# Patient Record
Sex: Female | Born: 1955 | Race: White | Hispanic: No | Smoking: Never smoker
Health system: Southern US, Community
[De-identification: ages and names within clinical notes are randomized; demographics above are authoritative.]

## PROBLEM LIST (undated history)

## (undated) DIAGNOSIS — F32A Depression, unspecified: Secondary | ICD-10-CM

## (undated) DIAGNOSIS — M1990 Unspecified osteoarthritis, unspecified site: Secondary | ICD-10-CM

## (undated) DIAGNOSIS — I10 Essential (primary) hypertension: Secondary | ICD-10-CM

## (undated) DIAGNOSIS — J449 Chronic obstructive pulmonary disease, unspecified: Secondary | ICD-10-CM

## (undated) DIAGNOSIS — F329 Major depressive disorder, single episode, unspecified: Secondary | ICD-10-CM

## (undated) HISTORY — DX: Depression, unspecified: F32.A

## (undated) HISTORY — DX: Essential (primary) hypertension: I10

## (undated) HISTORY — PX: FOOT SURGERY: SHX648

## (undated) HISTORY — DX: Major depressive disorder, single episode, unspecified: F32.9

## (undated) HISTORY — PX: KNEE SURGERY: SHX244

---

## 2000-06-30 ENCOUNTER — Encounter: Payer: Self-pay | Admitting: Surgery

## 2000-07-04 ENCOUNTER — Observation Stay (HOSPITAL_COMMUNITY): Admission: RE | Admit: 2000-07-04 | Discharge: 2000-07-05 | Payer: Self-pay | Admitting: Dermatology

## 2000-09-22 ENCOUNTER — Encounter: Admission: RE | Admit: 2000-09-22 | Discharge: 2000-12-21 | Payer: Self-pay | Admitting: Family Medicine

## 2000-11-28 ENCOUNTER — Ambulatory Visit (HOSPITAL_COMMUNITY): Admission: RE | Admit: 2000-11-28 | Discharge: 2000-11-28 | Payer: Self-pay | Admitting: Gastroenterology

## 2000-11-28 ENCOUNTER — Encounter: Payer: Self-pay | Admitting: Gastroenterology

## 2001-03-16 ENCOUNTER — Inpatient Hospital Stay (HOSPITAL_COMMUNITY): Admission: RE | Admit: 2001-03-16 | Discharge: 2001-03-20 | Payer: Self-pay | Admitting: Surgery

## 2001-03-18 ENCOUNTER — Encounter: Payer: Self-pay | Admitting: Surgery

## 2001-11-05 ENCOUNTER — Encounter: Payer: Self-pay | Admitting: Emergency Medicine

## 2001-11-05 ENCOUNTER — Inpatient Hospital Stay (HOSPITAL_COMMUNITY): Admission: EM | Admit: 2001-11-05 | Discharge: 2001-11-06 | Payer: Self-pay | Admitting: Emergency Medicine

## 2001-11-06 ENCOUNTER — Encounter: Payer: Self-pay | Admitting: Internal Medicine

## 2001-12-17 ENCOUNTER — Ambulatory Visit (HOSPITAL_COMMUNITY): Admission: RE | Admit: 2001-12-17 | Discharge: 2001-12-17 | Payer: Self-pay | Admitting: Specialist

## 2002-01-27 ENCOUNTER — Encounter: Admission: RE | Admit: 2002-01-27 | Discharge: 2002-04-27 | Payer: Self-pay | Admitting: Family Medicine

## 2003-12-13 ENCOUNTER — Ambulatory Visit (HOSPITAL_BASED_OUTPATIENT_CLINIC_OR_DEPARTMENT_OTHER): Admission: RE | Admit: 2003-12-13 | Discharge: 2003-12-13 | Payer: Self-pay | Admitting: Orthopedic Surgery

## 2005-01-24 ENCOUNTER — Other Ambulatory Visit: Admission: RE | Admit: 2005-01-24 | Discharge: 2005-01-24 | Payer: Self-pay | Admitting: *Deleted

## 2005-01-27 ENCOUNTER — Emergency Department (HOSPITAL_COMMUNITY): Admission: EM | Admit: 2005-01-27 | Discharge: 2005-01-28 | Payer: Self-pay | Admitting: Emergency Medicine

## 2005-01-27 ENCOUNTER — Encounter: Admission: RE | Admit: 2005-01-27 | Discharge: 2005-01-27 | Payer: Self-pay | Admitting: Family Medicine

## 2011-06-17 DIAGNOSIS — F32A Depression, unspecified: Secondary | ICD-10-CM | POA: Insufficient documentation

## 2011-06-17 DIAGNOSIS — I10 Essential (primary) hypertension: Secondary | ICD-10-CM | POA: Insufficient documentation

## 2012-02-24 DIAGNOSIS — E7800 Pure hypercholesterolemia, unspecified: Secondary | ICD-10-CM | POA: Insufficient documentation

## 2012-12-02 DIAGNOSIS — Z736 Limitation of activities due to disability: Secondary | ICD-10-CM | POA: Insufficient documentation

## 2015-01-02 DIAGNOSIS — H6980 Other specified disorders of Eustachian tube, unspecified ear: Secondary | ICD-10-CM | POA: Diagnosis not present

## 2015-03-01 DIAGNOSIS — M79671 Pain in right foot: Secondary | ICD-10-CM | POA: Diagnosis not present

## 2015-03-01 DIAGNOSIS — M25562 Pain in left knee: Secondary | ICD-10-CM | POA: Diagnosis not present

## 2015-03-01 DIAGNOSIS — M25561 Pain in right knee: Secondary | ICD-10-CM | POA: Diagnosis not present

## 2015-03-01 DIAGNOSIS — F329 Major depressive disorder, single episode, unspecified: Secondary | ICD-10-CM | POA: Diagnosis not present

## 2015-03-01 DIAGNOSIS — M79672 Pain in left foot: Secondary | ICD-10-CM | POA: Diagnosis not present

## 2015-03-01 DIAGNOSIS — R739 Hyperglycemia, unspecified: Secondary | ICD-10-CM | POA: Diagnosis not present

## 2015-03-01 DIAGNOSIS — E785 Hyperlipidemia, unspecified: Secondary | ICD-10-CM | POA: Diagnosis not present

## 2015-05-17 DIAGNOSIS — F329 Major depressive disorder, single episode, unspecified: Secondary | ICD-10-CM | POA: Diagnosis not present

## 2015-05-17 DIAGNOSIS — R739 Hyperglycemia, unspecified: Secondary | ICD-10-CM | POA: Diagnosis not present

## 2015-05-17 DIAGNOSIS — E785 Hyperlipidemia, unspecified: Secondary | ICD-10-CM | POA: Diagnosis not present

## 2015-05-26 DIAGNOSIS — M79671 Pain in right foot: Secondary | ICD-10-CM | POA: Diagnosis not present

## 2015-05-26 DIAGNOSIS — Z1389 Encounter for screening for other disorder: Secondary | ICD-10-CM | POA: Diagnosis not present

## 2015-05-26 DIAGNOSIS — F329 Major depressive disorder, single episode, unspecified: Secondary | ICD-10-CM | POA: Diagnosis not present

## 2015-05-26 DIAGNOSIS — M25561 Pain in right knee: Secondary | ICD-10-CM | POA: Diagnosis not present

## 2015-05-26 DIAGNOSIS — E785 Hyperlipidemia, unspecified: Secondary | ICD-10-CM | POA: Diagnosis not present

## 2015-05-26 DIAGNOSIS — M25562 Pain in left knee: Secondary | ICD-10-CM | POA: Diagnosis not present

## 2015-05-26 DIAGNOSIS — M79672 Pain in left foot: Secondary | ICD-10-CM | POA: Diagnosis not present

## 2015-05-26 DIAGNOSIS — R739 Hyperglycemia, unspecified: Secondary | ICD-10-CM | POA: Diagnosis not present

## 2015-08-09 DIAGNOSIS — H40033 Anatomical narrow angle, bilateral: Secondary | ICD-10-CM | POA: Diagnosis not present

## 2015-08-09 DIAGNOSIS — H2513 Age-related nuclear cataract, bilateral: Secondary | ICD-10-CM | POA: Diagnosis not present

## 2015-08-29 DIAGNOSIS — L723 Sebaceous cyst: Secondary | ICD-10-CM | POA: Diagnosis not present

## 2015-09-01 DIAGNOSIS — L723 Sebaceous cyst: Secondary | ICD-10-CM | POA: Diagnosis not present

## 2015-12-06 DIAGNOSIS — R739 Hyperglycemia, unspecified: Secondary | ICD-10-CM | POA: Diagnosis not present

## 2015-12-06 DIAGNOSIS — F329 Major depressive disorder, single episode, unspecified: Secondary | ICD-10-CM | POA: Diagnosis not present

## 2015-12-06 DIAGNOSIS — E785 Hyperlipidemia, unspecified: Secondary | ICD-10-CM | POA: Diagnosis not present

## 2015-12-07 DIAGNOSIS — R739 Hyperglycemia, unspecified: Secondary | ICD-10-CM | POA: Diagnosis not present

## 2015-12-07 DIAGNOSIS — F329 Major depressive disorder, single episode, unspecified: Secondary | ICD-10-CM | POA: Diagnosis not present

## 2015-12-07 DIAGNOSIS — J019 Acute sinusitis, unspecified: Secondary | ICD-10-CM | POA: Diagnosis not present

## 2015-12-07 DIAGNOSIS — M25562 Pain in left knee: Secondary | ICD-10-CM | POA: Diagnosis not present

## 2015-12-07 DIAGNOSIS — M79672 Pain in left foot: Secondary | ICD-10-CM | POA: Diagnosis not present

## 2015-12-07 DIAGNOSIS — E785 Hyperlipidemia, unspecified: Secondary | ICD-10-CM | POA: Diagnosis not present

## 2015-12-07 DIAGNOSIS — M79671 Pain in right foot: Secondary | ICD-10-CM | POA: Diagnosis not present

## 2015-12-07 DIAGNOSIS — M25561 Pain in right knee: Secondary | ICD-10-CM | POA: Diagnosis not present

## 2015-12-13 DIAGNOSIS — H6501 Acute serous otitis media, right ear: Secondary | ICD-10-CM | POA: Diagnosis not present

## 2015-12-13 DIAGNOSIS — R51 Headache: Secondary | ICD-10-CM | POA: Diagnosis not present

## 2016-02-26 DIAGNOSIS — M205X1 Other deformities of toe(s) (acquired), right foot: Secondary | ICD-10-CM | POA: Diagnosis not present

## 2016-02-26 DIAGNOSIS — M2011 Hallux valgus (acquired), right foot: Secondary | ICD-10-CM | POA: Diagnosis not present

## 2016-02-26 DIAGNOSIS — R2242 Localized swelling, mass and lump, left lower limb: Secondary | ICD-10-CM | POA: Diagnosis not present

## 2016-03-04 DIAGNOSIS — M25572 Pain in left ankle and joints of left foot: Secondary | ICD-10-CM | POA: Diagnosis not present

## 2016-03-15 DIAGNOSIS — M79672 Pain in left foot: Secondary | ICD-10-CM | POA: Diagnosis not present

## 2016-03-15 DIAGNOSIS — M7742 Metatarsalgia, left foot: Secondary | ICD-10-CM | POA: Diagnosis not present

## 2016-03-15 DIAGNOSIS — M898X7 Other specified disorders of bone, ankle and foot: Secondary | ICD-10-CM | POA: Diagnosis not present

## 2016-05-06 DIAGNOSIS — S40011A Contusion of right shoulder, initial encounter: Secondary | ICD-10-CM | POA: Diagnosis not present

## 2016-05-06 DIAGNOSIS — M546 Pain in thoracic spine: Secondary | ICD-10-CM | POA: Diagnosis not present

## 2016-07-22 ENCOUNTER — Ambulatory Visit (INDEPENDENT_AMBULATORY_CARE_PROVIDER_SITE_OTHER): Payer: Self-pay | Admitting: Orthopedic Surgery

## 2016-07-22 ENCOUNTER — Ambulatory Visit (INDEPENDENT_AMBULATORY_CARE_PROVIDER_SITE_OTHER): Payer: PPO

## 2016-07-22 ENCOUNTER — Encounter: Payer: Self-pay | Admitting: Orthopedic Surgery

## 2016-07-22 VITALS — BP 112/59 | HR 60 | Ht 62.0 in | Wt 200.0 lb

## 2016-07-22 DIAGNOSIS — M47816 Spondylosis without myelopathy or radiculopathy, lumbar region: Secondary | ICD-10-CM

## 2016-07-22 DIAGNOSIS — M25561 Pain in right knee: Secondary | ICD-10-CM | POA: Diagnosis not present

## 2016-07-22 DIAGNOSIS — M5441 Lumbago with sciatica, right side: Secondary | ICD-10-CM

## 2016-07-22 NOTE — Progress Notes (Addendum)
Chief Complaint  Patient presents with  . Knee Pain    Right knee pain   HPI   60 year old female presents with a history of 4 surgeries to the right knee back in the 5080s and 90s in [place]. She had meniscal work and anterior cruciate ligament reconstruction.  Comes in complaining of right knee pain, right knee swelling, instability in the right knee.  Review of Systems  Neurological: Positive for tingling.  Psychiatric/Behavioral: Positive for depression.  All other systems reviewed and are negative.   Past Medical History:  Diagnosis Date  . Depression   . Hypertension     Surgical history 4 surgeries on the right knee    Social History  Substance Use Topics  . Smoking status: Not on file  . Smokeless tobacco: Not on file  . Alcohol use Not on file   No current outpatient prescriptions on file.  BP (!) 112/59   Pulse 60   Ht 5\' 2"  (1.575 m)   Wt 200 lb (90.7 kg)   BMI 36.58 kg/m   Physical Exam  Constitutional: She is oriented to person, place, and time. She appears well-developed and well-nourished. No distress.  Cardiovascular: Normal rate and intact distal pulses.   Neurological: She is alert and oriented to person, place, and time. She has normal reflexes. She exhibits normal muscle tone. Coordination normal.  Skin: Skin is warm and dry. No rash noted. She is not diaphoretic. No erythema. No pallor.  Psychiatric: She has a normal mood and affect. Her behavior is normal. Judgment and thought content normal.    Ortho Exam She uses a cane for ambulation  Left knee shows AP laxity hyperextension otherwise full range of motion no gross collateral ligament instability muscle tone is normal. Skin shows no rashes or nodularity. Distal pulses are intact and sensation is normal  The right lower extremity range of motion shows 110 knee flexion hyperextension of 5. There is a scar medially and an anterior scar from reconstructive surgery she has swelling over the  anteromedial tibia. She has a lot of muscle guarding but it apparently feels like she has a anterior cruciate ligament laxity PCL appears to be intact collateral ligaments otherwise stable. Skin incisions are well-healed. Distal pulses are intact sensation is normal    Plain films of the knee show moderately severe arthritis with 2 staples from most likely anterior cruciate ligament surgeryHip flexion was normal in each hip  She did have tenderness in the lower back  (History of multiple bouts of sciatica)    ASSESSMENT: My personal interpretation of the images:  Osteoarthritis moderate right knee primarily medial compartment with 2 staples from reconstructive surgery  Lumbar spine films  Show lumbar spondylosis at L3-4, L4-5 and L5-S1  Encounter Diagnoses  Name Primary?  . Right knee pain Yes  . Right-sided low back pain with right-sided sciatica   . Spondylosis of lumbar region without myelopathy or radiculopathy      PLAN Begin physical therapy complete 6 week course come back for reevaluation of her back and then we can make plans about the knee   Fuller CanadaStanley Shaquanda Graves, MD 07/22/2016 2:49 PM

## 2016-07-22 NOTE — Patient Instructions (Signed)
Start therapy in October

## 2016-07-22 NOTE — Addendum Note (Signed)
Addended by: Adella HareBOOTHE, JAIME B on: 07/22/2016 03:22 PM   Modules accepted: Orders

## 2016-08-07 DIAGNOSIS — E785 Hyperlipidemia, unspecified: Secondary | ICD-10-CM | POA: Diagnosis not present

## 2016-08-07 DIAGNOSIS — M79672 Pain in left foot: Secondary | ICD-10-CM | POA: Diagnosis not present

## 2016-08-07 DIAGNOSIS — M79671 Pain in right foot: Secondary | ICD-10-CM | POA: Diagnosis not present

## 2016-08-07 DIAGNOSIS — M25562 Pain in left knee: Secondary | ICD-10-CM | POA: Diagnosis not present

## 2016-08-07 DIAGNOSIS — R739 Hyperglycemia, unspecified: Secondary | ICD-10-CM | POA: Diagnosis not present

## 2016-08-07 DIAGNOSIS — Z6836 Body mass index (BMI) 36.0-36.9, adult: Secondary | ICD-10-CM | POA: Diagnosis not present

## 2016-08-07 DIAGNOSIS — M25561 Pain in right knee: Secondary | ICD-10-CM | POA: Diagnosis not present

## 2016-08-07 DIAGNOSIS — F329 Major depressive disorder, single episode, unspecified: Secondary | ICD-10-CM | POA: Diagnosis not present

## 2016-09-06 DIAGNOSIS — L247 Irritant contact dermatitis due to plants, except food: Secondary | ICD-10-CM | POA: Diagnosis not present

## 2016-09-06 DIAGNOSIS — F331 Major depressive disorder, recurrent, moderate: Secondary | ICD-10-CM | POA: Diagnosis not present

## 2016-09-06 DIAGNOSIS — Z6836 Body mass index (BMI) 36.0-36.9, adult: Secondary | ICD-10-CM | POA: Diagnosis not present

## 2016-10-08 ENCOUNTER — Encounter: Payer: Self-pay | Admitting: Orthopedic Surgery

## 2016-10-28 ENCOUNTER — Ambulatory Visit: Payer: PPO | Admitting: Orthopedic Surgery

## 2016-11-11 ENCOUNTER — Encounter: Payer: Self-pay | Admitting: Orthopedic Surgery

## 2016-11-26 ENCOUNTER — Telehealth (HOSPITAL_COMMUNITY): Payer: Self-pay | Admitting: Physical Therapy

## 2016-11-26 NOTE — Telephone Encounter (Signed)
Pt l/m yesterday that she had another MD apptment and could not come today. NF °

## 2016-12-03 ENCOUNTER — Ambulatory Visit (HOSPITAL_COMMUNITY): Payer: PPO | Admitting: Physical Therapy

## 2016-12-05 ENCOUNTER — Ambulatory Visit (HOSPITAL_COMMUNITY): Payer: PPO | Admitting: Physical Therapy

## 2017-02-03 DIAGNOSIS — M25561 Pain in right knee: Secondary | ICD-10-CM | POA: Diagnosis not present

## 2017-02-03 DIAGNOSIS — E785 Hyperlipidemia, unspecified: Secondary | ICD-10-CM | POA: Diagnosis not present

## 2017-02-03 DIAGNOSIS — R739 Hyperglycemia, unspecified: Secondary | ICD-10-CM | POA: Diagnosis not present

## 2017-02-03 DIAGNOSIS — M25562 Pain in left knee: Secondary | ICD-10-CM | POA: Diagnosis not present

## 2017-02-03 DIAGNOSIS — M79672 Pain in left foot: Secondary | ICD-10-CM | POA: Diagnosis not present

## 2017-02-03 DIAGNOSIS — M79671 Pain in right foot: Secondary | ICD-10-CM | POA: Diagnosis not present

## 2017-02-03 DIAGNOSIS — F331 Major depressive disorder, recurrent, moderate: Secondary | ICD-10-CM | POA: Diagnosis not present

## 2017-02-03 DIAGNOSIS — Z6839 Body mass index (BMI) 39.0-39.9, adult: Secondary | ICD-10-CM | POA: Diagnosis not present

## 2017-07-30 DIAGNOSIS — B001 Herpesviral vesicular dermatitis: Secondary | ICD-10-CM | POA: Diagnosis not present

## 2017-07-30 DIAGNOSIS — M79671 Pain in right foot: Secondary | ICD-10-CM | POA: Diagnosis not present

## 2017-07-30 DIAGNOSIS — M25561 Pain in right knee: Secondary | ICD-10-CM | POA: Diagnosis not present

## 2017-07-30 DIAGNOSIS — J0101 Acute recurrent maxillary sinusitis: Secondary | ICD-10-CM | POA: Diagnosis not present

## 2017-07-30 DIAGNOSIS — Z6837 Body mass index (BMI) 37.0-37.9, adult: Secondary | ICD-10-CM | POA: Diagnosis not present

## 2017-07-30 DIAGNOSIS — M25562 Pain in left knee: Secondary | ICD-10-CM | POA: Diagnosis not present

## 2017-07-30 DIAGNOSIS — M79672 Pain in left foot: Secondary | ICD-10-CM | POA: Diagnosis not present

## 2017-07-30 DIAGNOSIS — R739 Hyperglycemia, unspecified: Secondary | ICD-10-CM | POA: Diagnosis not present

## 2017-08-13 ENCOUNTER — Ambulatory Visit (INDEPENDENT_AMBULATORY_CARE_PROVIDER_SITE_OTHER): Payer: PPO | Admitting: Orthopedic Surgery

## 2017-08-13 ENCOUNTER — Encounter: Payer: Self-pay | Admitting: Orthopedic Surgery

## 2017-08-13 ENCOUNTER — Telehealth: Payer: Self-pay | Admitting: Orthopedic Surgery

## 2017-08-13 VITALS — BP 112/64 | HR 69 | Ht 64.0 in | Wt 224.0 lb

## 2017-08-13 DIAGNOSIS — M2352 Chronic instability of knee, left knee: Secondary | ICD-10-CM | POA: Diagnosis not present

## 2017-08-13 DIAGNOSIS — M170 Bilateral primary osteoarthritis of knee: Secondary | ICD-10-CM | POA: Diagnosis not present

## 2017-08-13 DIAGNOSIS — M2351 Chronic instability of knee, right knee: Secondary | ICD-10-CM

## 2017-08-13 NOTE — Telephone Encounter (Signed)
NEEDS BRACE

## 2017-08-13 NOTE — Progress Notes (Addendum)
Routine follow-up  Patient plans of bilateral knee instability  She is status post anterior cruciate ligament reconstruction right knee. She's been in bilateral anterior cruciate ligament braces for many years. She has osteoarthritis both knees with chronic pain and swelling bilaterally  Her main problem however is instability  Review of systems she is currently healthy doing well she does have a chronic radiculopathy in the right lower extremity. She did perform her home back exercises with improvement in her lower back pain  Examination reveals wobbling gait with cane  Meds ordered this encounter  Medications  . traZODone (DESYREL) 150 MG tablet  . lovastatin (MEVACOR) 20 MG tablet  . HYDROcodone-acetaminophen (NORCO/VICODIN) 5-325 MG tablet  . furosemide (LASIX) 20 MG tablet  . fluticasone (FLONASE) 50 MCG/ACT nasal spray  . FLUoxetine (PROZAC) 20 MG tablet  . buPROPion (WELLBUTRIN XL) 150 MG 24 hr tablet  . acyclovir (ZOVIRAX) 400 MG tablet     She has a brace on the right knee and did not wear the left one today. They are not fitting  She has multiple scars from surgery on her right knee. She has a varus alignment to the knee. Her flexion is approximately 120 she has instability in the anterior posterior plane motor exam is normal  On the left knee we find varus alignment medial joint line tenderness no effusion flexion arc 120 instability anteroposterior plane with hyperextension motor exam normal skin normal neurovascular exam intact  Encounter Diagnoses  Name Primary?  . Chronic instability of knee, right knee Yes  . Chronic instability of left knee   . Primary osteoarthritis of both knees     Recommend bilateral anterior cruciate ligament braces for instability  Addendum: The patient's thigh to calf ratio is larger than that which will allow normal bracing and she will require custom brace

## 2017-10-08 DIAGNOSIS — M170 Bilateral primary osteoarthritis of knee: Secondary | ICD-10-CM | POA: Diagnosis not present

## 2018-01-22 DIAGNOSIS — N760 Acute vaginitis: Secondary | ICD-10-CM | POA: Diagnosis not present

## 2018-01-22 DIAGNOSIS — L03032 Cellulitis of left toe: Secondary | ICD-10-CM | POA: Diagnosis not present

## 2018-01-22 DIAGNOSIS — Z6838 Body mass index (BMI) 38.0-38.9, adult: Secondary | ICD-10-CM | POA: Diagnosis not present

## 2018-01-22 DIAGNOSIS — E785 Hyperlipidemia, unspecified: Secondary | ICD-10-CM | POA: Diagnosis not present

## 2018-01-22 DIAGNOSIS — R739 Hyperglycemia, unspecified: Secondary | ICD-10-CM | POA: Diagnosis not present

## 2018-01-22 DIAGNOSIS — F331 Major depressive disorder, recurrent, moderate: Secondary | ICD-10-CM | POA: Diagnosis not present

## 2018-01-22 DIAGNOSIS — M546 Pain in thoracic spine: Secondary | ICD-10-CM | POA: Diagnosis not present

## 2018-01-22 DIAGNOSIS — M25561 Pain in right knee: Secondary | ICD-10-CM | POA: Diagnosis not present

## 2018-01-22 DIAGNOSIS — M25562 Pain in left knee: Secondary | ICD-10-CM | POA: Diagnosis not present

## 2018-03-31 DIAGNOSIS — M25561 Pain in right knee: Secondary | ICD-10-CM | POA: Diagnosis not present

## 2018-03-31 DIAGNOSIS — L03319 Cellulitis of trunk, unspecified: Secondary | ICD-10-CM | POA: Diagnosis not present

## 2018-05-22 DIAGNOSIS — Z1231 Encounter for screening mammogram for malignant neoplasm of breast: Secondary | ICD-10-CM | POA: Diagnosis not present

## 2018-07-20 DIAGNOSIS — R739 Hyperglycemia, unspecified: Secondary | ICD-10-CM | POA: Diagnosis not present

## 2018-07-20 DIAGNOSIS — E785 Hyperlipidemia, unspecified: Secondary | ICD-10-CM | POA: Diagnosis not present

## 2018-07-20 DIAGNOSIS — F331 Major depressive disorder, recurrent, moderate: Secondary | ICD-10-CM | POA: Diagnosis not present

## 2018-07-22 DIAGNOSIS — M79671 Pain in right foot: Secondary | ICD-10-CM | POA: Diagnosis not present

## 2018-07-22 DIAGNOSIS — R739 Hyperglycemia, unspecified: Secondary | ICD-10-CM | POA: Diagnosis not present

## 2018-07-22 DIAGNOSIS — M546 Pain in thoracic spine: Secondary | ICD-10-CM | POA: Diagnosis not present

## 2018-07-22 DIAGNOSIS — M79672 Pain in left foot: Secondary | ICD-10-CM | POA: Diagnosis not present

## 2018-07-22 DIAGNOSIS — M25562 Pain in left knee: Secondary | ICD-10-CM | POA: Diagnosis not present

## 2018-07-22 DIAGNOSIS — E785 Hyperlipidemia, unspecified: Secondary | ICD-10-CM | POA: Diagnosis not present

## 2018-07-22 DIAGNOSIS — M25561 Pain in right knee: Secondary | ICD-10-CM | POA: Diagnosis not present

## 2018-07-22 DIAGNOSIS — Z6839 Body mass index (BMI) 39.0-39.9, adult: Secondary | ICD-10-CM | POA: Diagnosis not present

## 2019-01-15 DIAGNOSIS — R739 Hyperglycemia, unspecified: Secondary | ICD-10-CM | POA: Diagnosis not present

## 2019-01-15 DIAGNOSIS — E785 Hyperlipidemia, unspecified: Secondary | ICD-10-CM | POA: Diagnosis not present

## 2019-01-18 DIAGNOSIS — M79671 Pain in right foot: Secondary | ICD-10-CM | POA: Diagnosis not present

## 2019-01-18 DIAGNOSIS — M79672 Pain in left foot: Secondary | ICD-10-CM | POA: Diagnosis not present

## 2019-01-18 DIAGNOSIS — R739 Hyperglycemia, unspecified: Secondary | ICD-10-CM | POA: Diagnosis not present

## 2019-01-18 DIAGNOSIS — M25561 Pain in right knee: Secondary | ICD-10-CM | POA: Diagnosis not present

## 2019-01-18 DIAGNOSIS — E785 Hyperlipidemia, unspecified: Secondary | ICD-10-CM | POA: Diagnosis not present

## 2019-01-18 DIAGNOSIS — M25562 Pain in left knee: Secondary | ICD-10-CM | POA: Diagnosis not present

## 2019-01-18 DIAGNOSIS — Z6841 Body Mass Index (BMI) 40.0 and over, adult: Secondary | ICD-10-CM | POA: Diagnosis not present

## 2019-01-18 DIAGNOSIS — M546 Pain in thoracic spine: Secondary | ICD-10-CM | POA: Diagnosis not present

## 2022-11-25 DIAGNOSIS — M67912 Unspecified disorder of synovium and tendon, left shoulder: Secondary | ICD-10-CM | POA: Insufficient documentation

## 2023-01-23 IMAGING — MR MRI KNEE LT W/O CONTRAST
6 series · 40 of 40 positions shown · IV contrast (Off)
Comparison: none

MRI OF THE LEFT KNEE WITHOUT CONTRAST
CLINICAL HISTORY: Chronic bilateral knee pain.
TECHNIQUE: Multisequential multiplanar imaging was performed of the left knee.

[Series 4: T2 · axial · left · 4.0mm · 0.74mm/px · z∈[-72,+43]mm · 8 of 24 slices shown (1 of 3)]
[im 1/24]
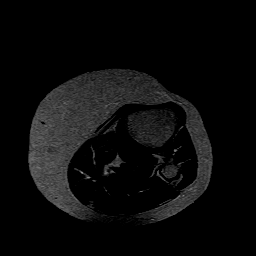
[im 4/24]
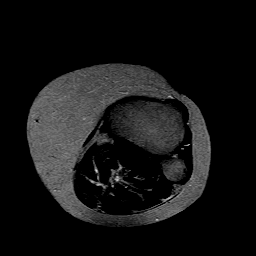
[im 7/24]
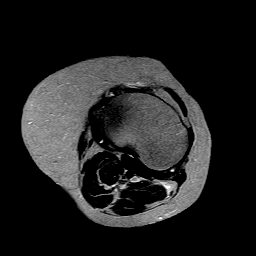
[im 10/24]
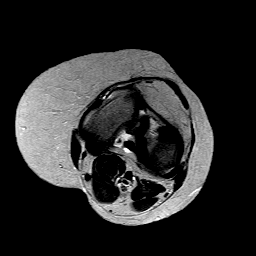
[im 14/24]
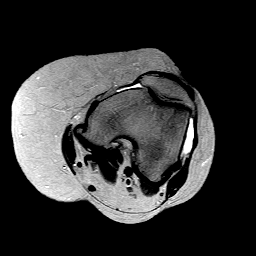
[im 17/24]
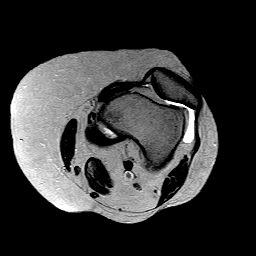
[im 20/24]
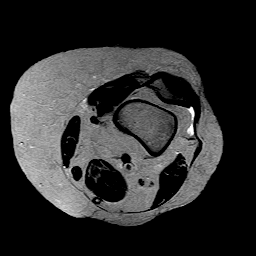
[im 24/24]
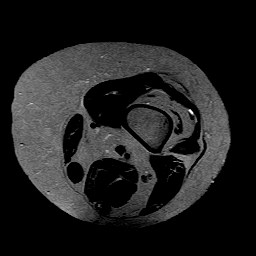

[Series 5: T2 · sagittal · left · 4.0mm · 0.70mm/px · 7 of 20 slices shown (2 of 3)]
[im 1/20]
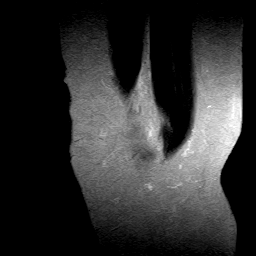
[im 4/20]
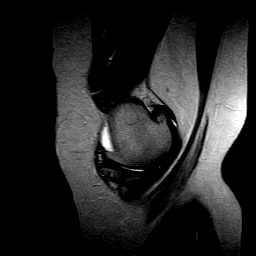
[im 7/20]
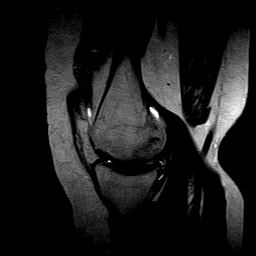
[im 10/20]
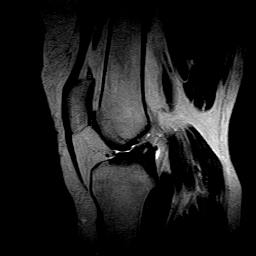
[im 13/20]
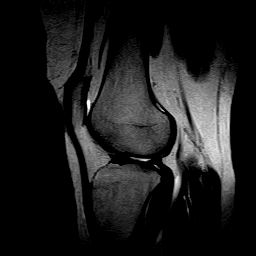
[im 16/20]
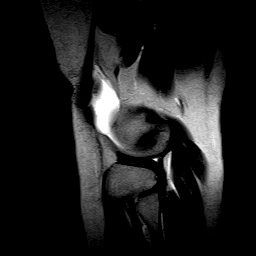
[im 20/20]
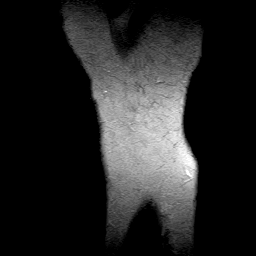

[Series 6: PD · sagittal · left · 4.0mm · 0.70mm/px · 7 of 20 slices shown]
[im 1/20]
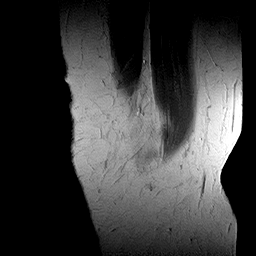
[im 4/20]
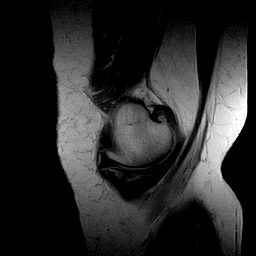
[im 7/20]
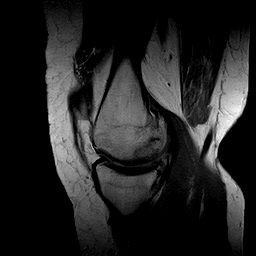
[im 10/20]
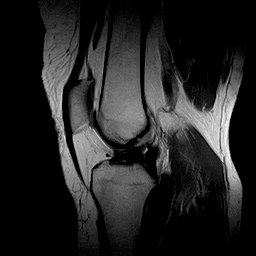
[im 13/20]
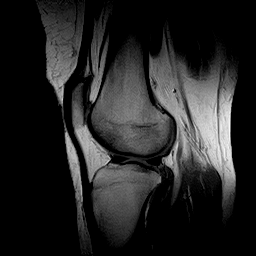
[im 16/20]
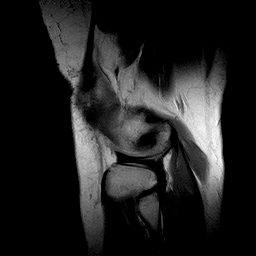
[im 20/20]
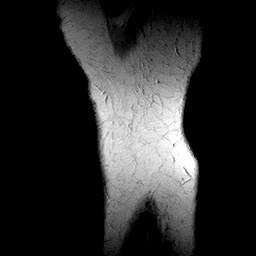

[Series 9: T1 · coronal · left · 4.0mm · 0.35mm/px · 7 of 22 slices shown]
[im 1/22]
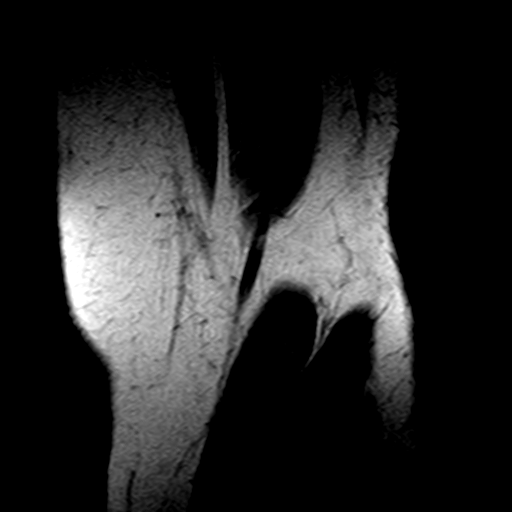
[im 4/22]
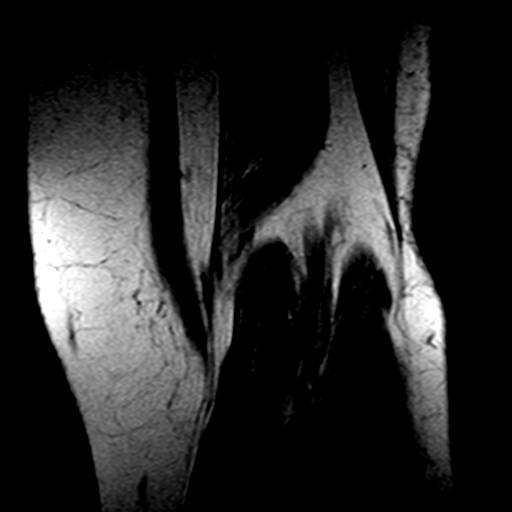
[im 8/22]
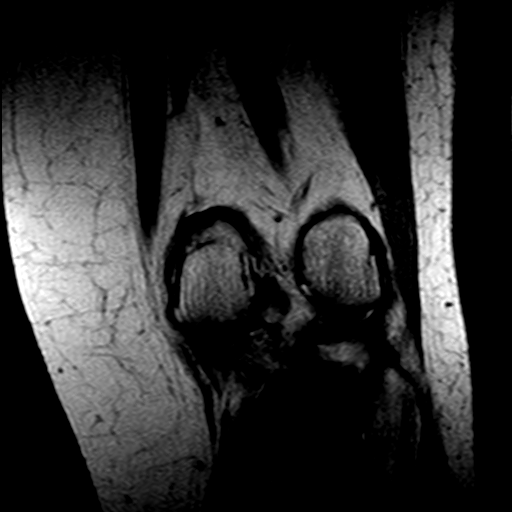
[im 11/22]
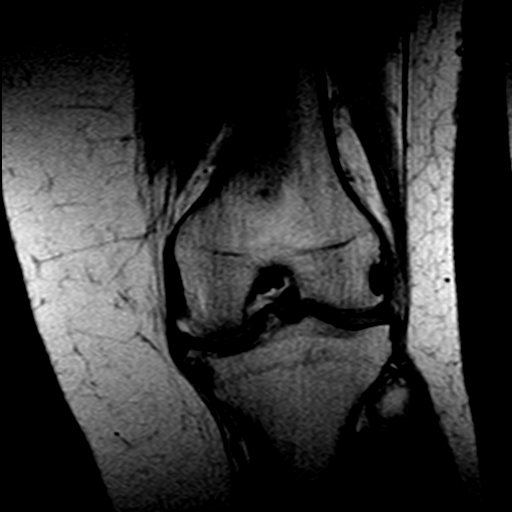
[im 15/22]
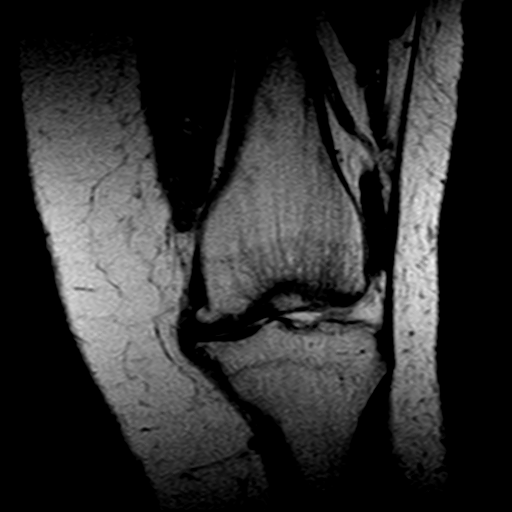
[im 18/22]
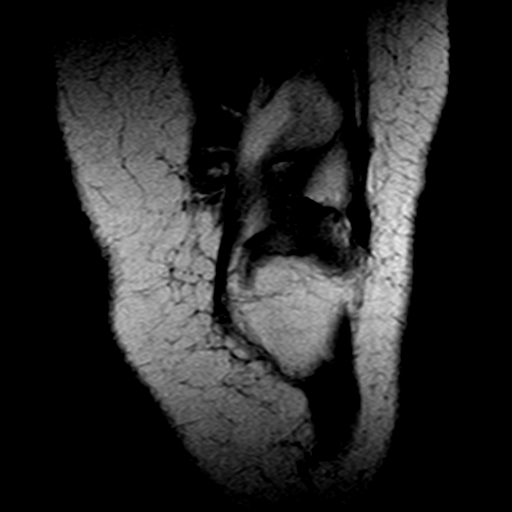
[im 22/22]
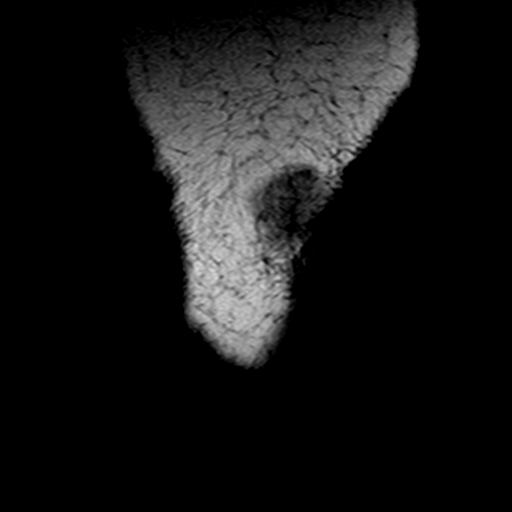

[Series 10: fat sepg cor · coronal · left · 4.0mm · 0.70mm/px · 7 of 22 slices shown]
[im 1/22]
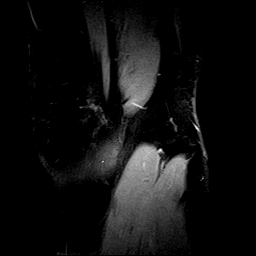
[im 4/22]
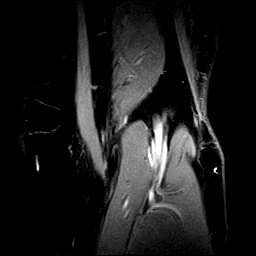
[im 8/22]
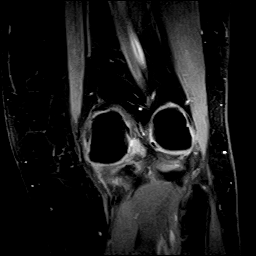
[im 11/22]
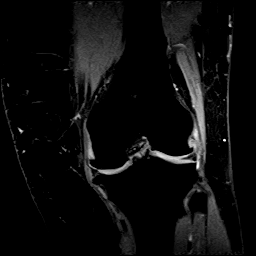
[im 15/22]
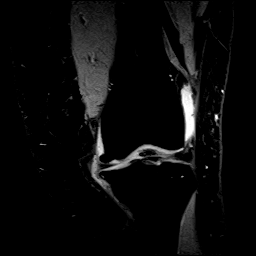
[im 18/22]
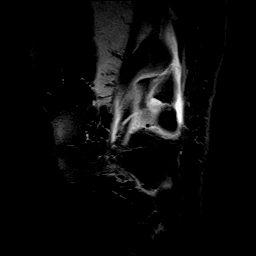
[im 22/22]
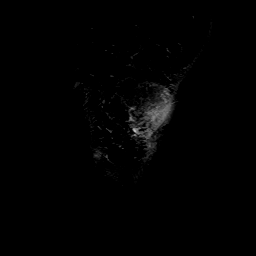

[Series 11: T2 · oblique · left · 3.0mm · 0.66mm/px · 4 of 12 slices shown (3 of 3)]
[im 1/12]
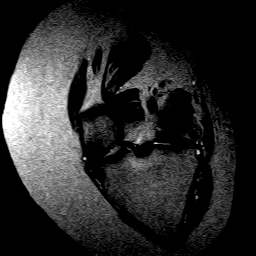
[im 4/12]
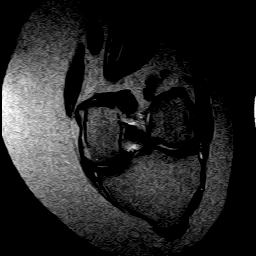
[im 8/12]
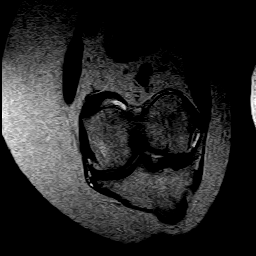
[im 12/12]
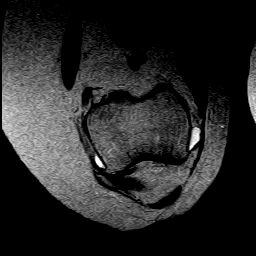

[40 of 40 positions shown; findings below may reference images not displayed]

FINDINGS: There is myxoid degeneration of the lateral and medial menisci with evidence of a tear in the posterior horn of the lateral meniscus and degeneration, thinning, fraying, and tearing of the posterior horn and body of the medial meniscus. 

The anterior cruciate ligament appears to be intact, however, there is thinning of the anterior cruciate ligament, which suggests the possibility of anterior cruciate ligament strain or possibly even intrasubstance or partial-thickness tearing. The posterior cruciate ligament appears intact as do the medial and lateral collateral ligaments. The quadriceps and patellar tendons appear to be intact.   

There is moderate to severe degenerative change of the medial compartment with joint space narrowing, osteophytosis, and subchondral irregularity and erosion. There is some thinning of the chondral cartilage and probable chondral loss of the medial femoral condyle and medial tibial plateau. 

Moderate degenerative change of the lateral compartment and moderate to severe degenerative change of the patellofemoral compartment including chondromalacia patella. There is thinning of the chondral cartilage and possible chondral loss of the lateral facet. There is some lateral malposition of the patella within a shallow patellar fossa. 

Large amount of retropatellar and peripatellar joint fluid and a small to moderate amount of intra-articular joint fluid.
IMPRESSION: 1.
Myxoid degeneration of the lateral and medial menisci with evidence of a tear involving the posterior horn of the lateral meniscus. There is degeneration, thinning, fraying, and tearing of the posterior horn and extending into the body of the medial meniscus. Associated moderate to severe degenerative change of the medial compartment with joint space narrowing, osteophytosis, and subchondral irregularity and erosion. Thinning of the chondral cartilage and probable chondral loss of the medial femoral condyle and medial tibial plateau. 

2.
Moderate degenerative change of the lateral compartment and moderate to severe degenerative change of the patellofemoral compartment including joint space narrowing and osteophytosis of the lateral compartment. There is chondromalacia patella with thinning of the chondral cartilage and possible chondral loss of the lateral facet of the patella. Lateral malposition of the patella within a shallow patellar fossa appears to represent a degree of lateral tracking abnormality.

3.
Large amount of retropatellar and peripatellar joint fluid and a small to moderate amount of intra-articular joint fluid. 

4.
Anterior cruciate ligament appears to be intact, however, it appears to be thinned and decreased in caliber. The possibility of anterior cruciate ligament strain or less likely intrasubstance or partial-thickness anterior cruciate ligament tearing should be considered.

## 2023-01-23 IMAGING — MR MRI KNEE RT W/O CONTRAST
6 series · 40 of 40 positions shown · IV contrast (Off)
Comparison: none

MRI OF THE RIGHT KNEE WITHOUT CONTRAST
CLINICAL HISTORY: Chronic bilateral knee pain.
TECHNIQUE: Multisequential multiplanar imaging was performed of the right knee.

[Series 3: T2 · axial · right · 4.0mm · 0.74mm/px · z∈[-73,+42]mm · 8 of 24 slices shown (1 of 3)]
[im 1/24]
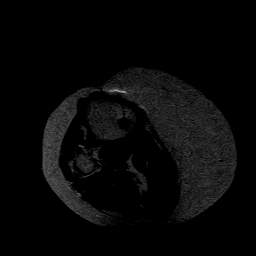
[im 4/24]
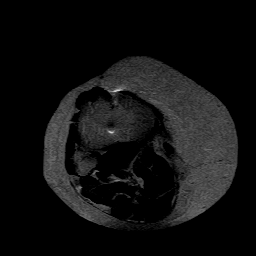
[im 7/24]
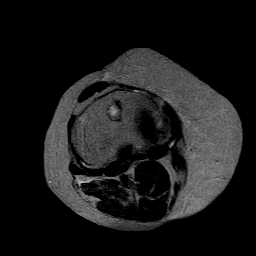
[im 10/24]
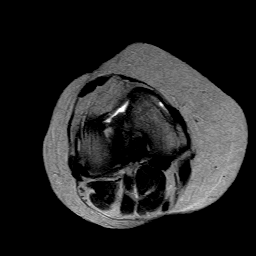
[im 14/24]
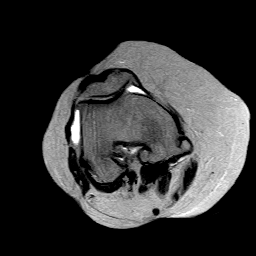
[im 17/24]
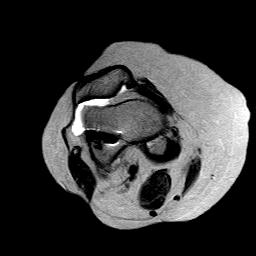
[im 20/24]
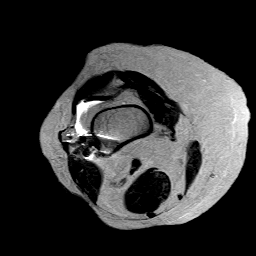
[im 24/24]
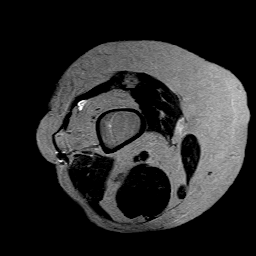

[Series 4: T2 · sagittal · right · 4.0mm · 0.70mm/px · 7 of 20 slices shown (2 of 3)]
[im 1/20]
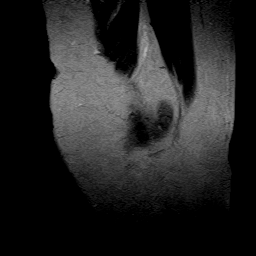
[im 4/20]
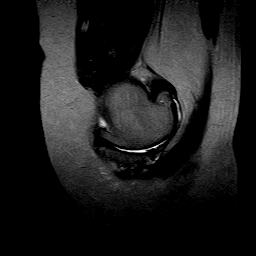
[im 7/20]
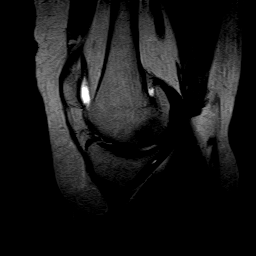
[im 10/20]
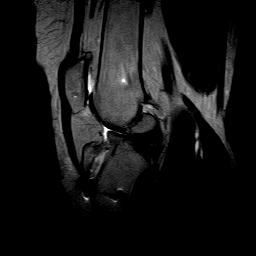
[im 13/20]
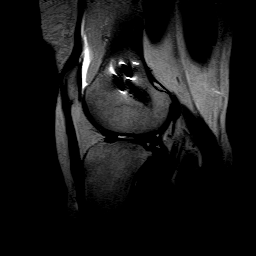
[im 16/20]
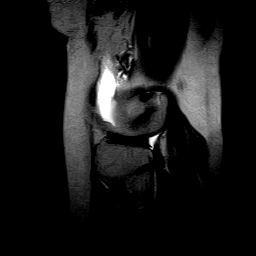
[im 20/20]
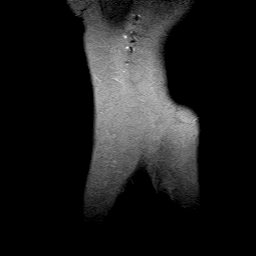

[Series 5: PD · sagittal · right · 4.0mm · 0.70mm/px · 7 of 20 slices shown]
[im 1/20]
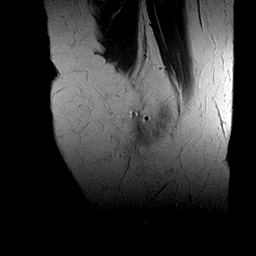
[im 4/20]
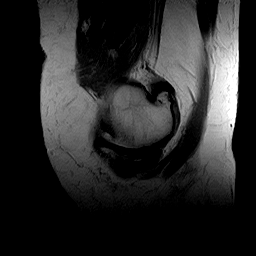
[im 7/20]
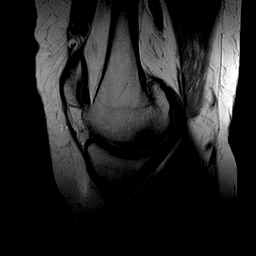
[im 10/20]
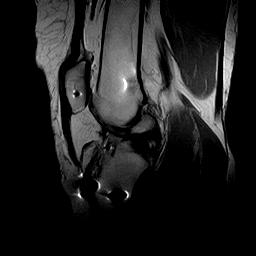
[im 13/20]
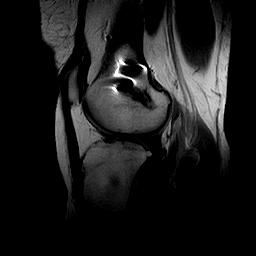
[im 16/20]
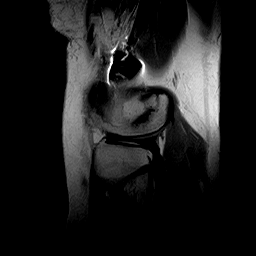
[im 20/20]
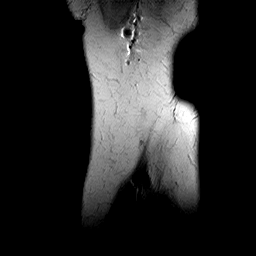

[Series 8: T1 · coronal · right · 4.0mm · 0.35mm/px · 7 of 22 slices shown]
[im 1/22]
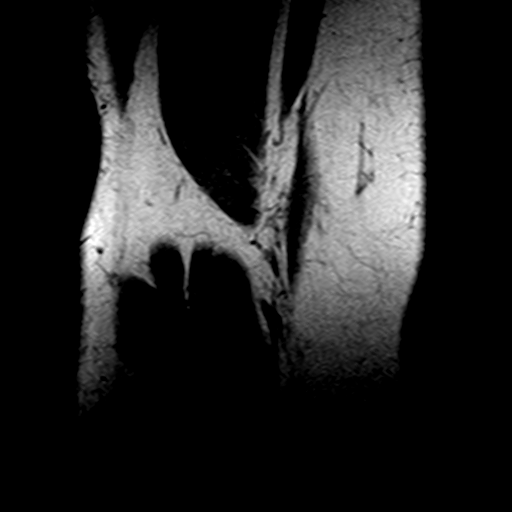
[im 4/22]
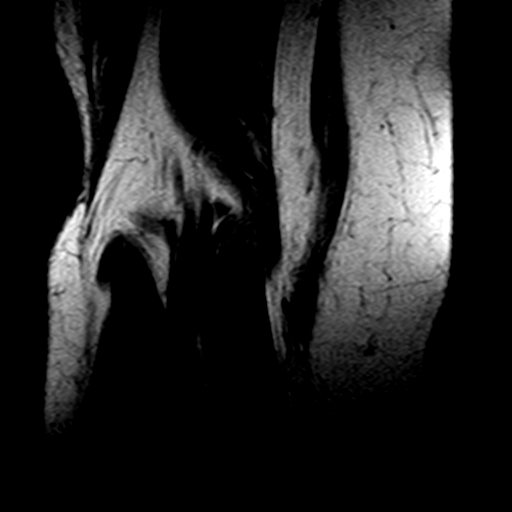
[im 8/22]
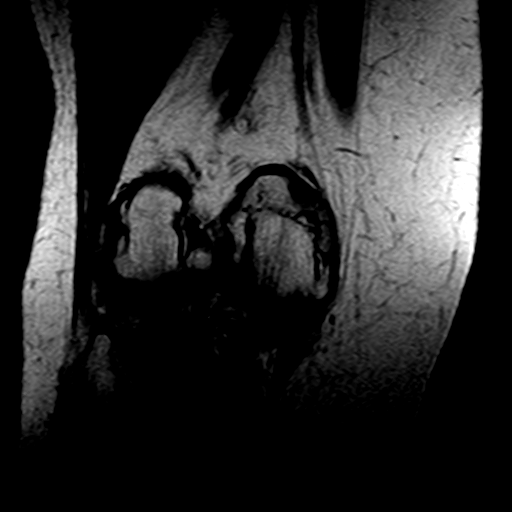
[im 11/22]
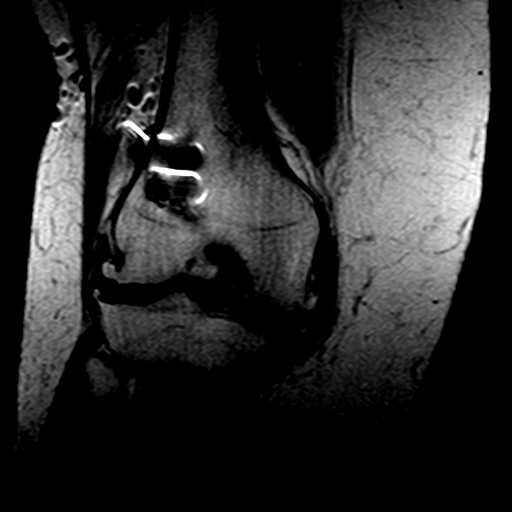
[im 15/22]
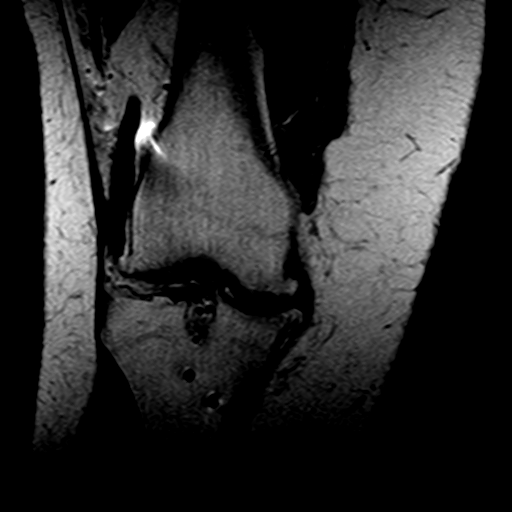
[im 18/22]
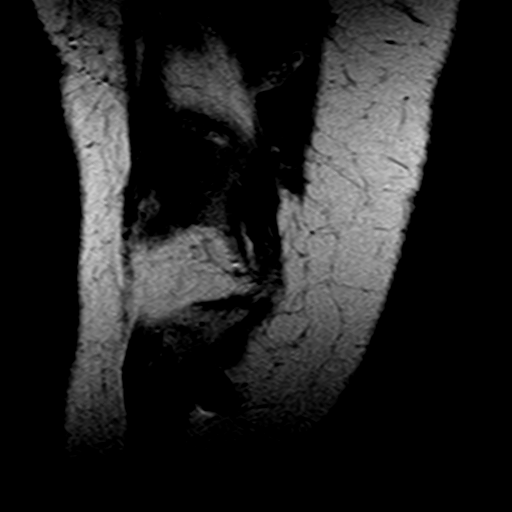
[im 22/22]
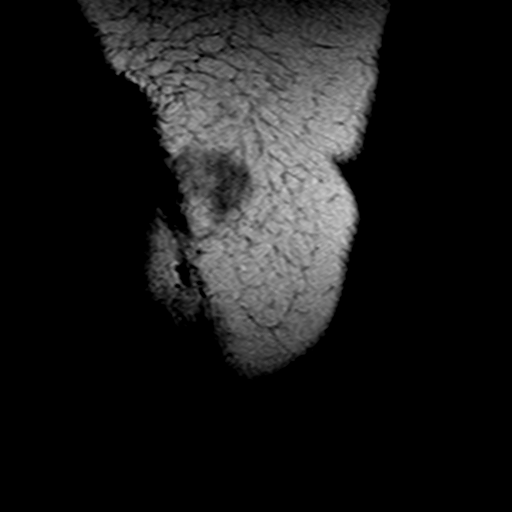

[Series 9: fat sepg cor · coronal · right · 4.0mm · 0.70mm/px · 7 of 22 slices shown]
[im 1/22]
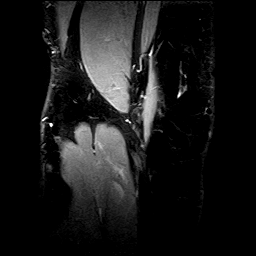
[im 4/22]
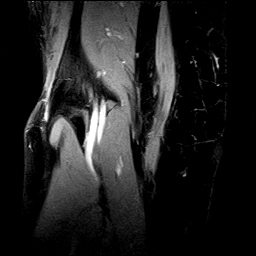
[im 8/22]
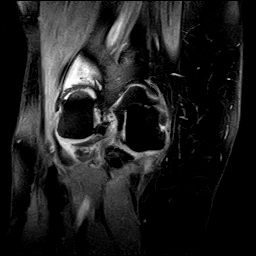
[im 11/22]
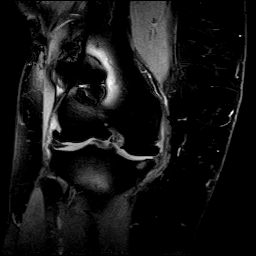
[im 15/22]
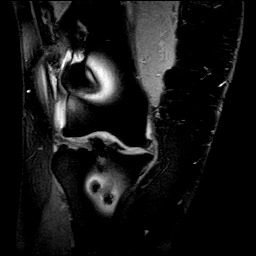
[im 18/22]
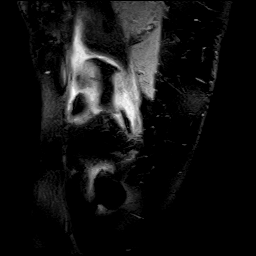
[im 22/22]
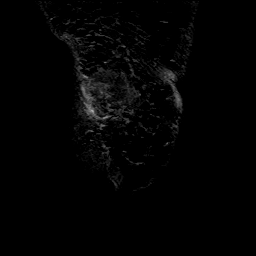

[Series 10: T2 · coronal · right · 3.0mm · 0.66mm/px · 4 of 12 slices shown (3 of 3)]
[im 1/12]
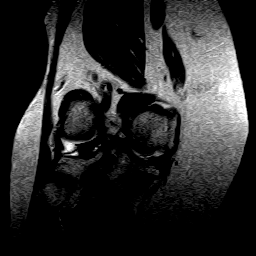
[im 4/12]
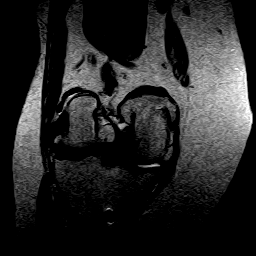
[im 8/12]
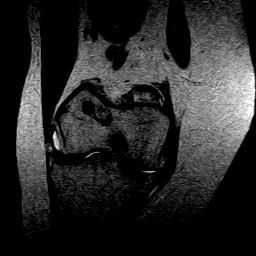
[im 12/12]
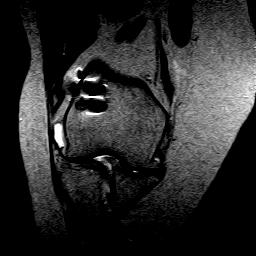

[40 of 40 positions shown; findings below may reference images not displayed]

FINDINGS: Evidence of previous anterior cruciate ligament repair, however, the repaired anterior cruciate ligament is not visualized consistent with complete re-tearing. 

Myxoid degeneration of the lateral meniscus without evidence of surface tearing. 

Myxoid degeneration of the medial meniscus with degeneration, thinning, fraying, and tearing of the posterior horn and body and extending into the anterior horn of the medial meniscus. 

The posterior cruciate ligament appears intact as do the medial and lateral collateral ligaments. The quadriceps and patellar tendons appear to be intact.   

There is severe degenerative change of the medial compartment with joint space narrowing, osteophytosis, and subchondral irregularity and erosion. Thinning of the chondral cartilage and probable chondral loss. 

Moderate to severe degenerative change of the lateral and patellofemoral compartments including chondromalacia patella. Thinning of the chondral cartilage and possible chondral loss of the medial and lateral facets. Some lateral malposition of the patella within a shallow patellar fossa.

Large amount of retropatellar and peripatellar joint fluid and a small to moderate amount of intra-articular joint fluid.
IMPRESSION: 1.
Myxoid degeneration of the lateral and medial menisci, however, there does appear to be degeneration, thinning, fraying, and tearing of the posterior horn and body and extending into the anterior horn of the medial meniscus. Associated severe degenerative change of the medial compartment with joint space narrowing, osteophytosis, and subchondral irregularity and erosion. Thinning of the chondral cartilage and probable chondral loss of the medial femoral condyle and the medial tibial plateau. 

2.
Moderate to severe degenerative change of the lateral compartment with some joint space narrowing and osteophytosis. Moderate to severe degenerative change of the patellofemoral compartment including chondromalacia patella. There is thinning of the chondral cartilage and probable chondral loss of the medial and lateral facets. Some lateral malposition of the patella within a shallow patellar fossa is consistent with a degree of lateral tracking abnormality.

3.
Large amount of retropatellar and peripatellar joint fluid and a small to moderate amount of intra-articular joint fluid. 

4.
The patient has undergone previous anterior cruciate ligament repair, however, the repaired anterior cruciate ligament is not visualized on any imaging sequence, including thin-cut sequence. The repaired anterior cruciate ligament appears to be completely torn. Lack of fluid and edema suggests this may be a chronic finding.

## 2023-07-11 DIAGNOSIS — G8929 Other chronic pain: Secondary | ICD-10-CM | POA: Insufficient documentation

## 2023-07-11 DIAGNOSIS — R5383 Other fatigue: Secondary | ICD-10-CM | POA: Insufficient documentation

## 2023-07-11 DIAGNOSIS — A048 Other specified bacterial intestinal infections: Secondary | ICD-10-CM | POA: Insufficient documentation

## 2023-07-11 DIAGNOSIS — E039 Hypothyroidism, unspecified: Secondary | ICD-10-CM | POA: Insufficient documentation

## 2023-07-11 DIAGNOSIS — E559 Vitamin D deficiency, unspecified: Secondary | ICD-10-CM | POA: Insufficient documentation

## 2023-07-11 DIAGNOSIS — I2510 Atherosclerotic heart disease of native coronary artery without angina pectoris: Secondary | ICD-10-CM | POA: Insufficient documentation

## 2023-07-11 DIAGNOSIS — M069 Rheumatoid arthritis, unspecified: Secondary | ICD-10-CM | POA: Insufficient documentation

## 2023-07-11 DIAGNOSIS — R7303 Prediabetes: Secondary | ICD-10-CM | POA: Insufficient documentation

## 2023-07-11 DIAGNOSIS — I10 Essential (primary) hypertension: Secondary | ICD-10-CM | POA: Insufficient documentation

## 2024-05-27 DIAGNOSIS — M25561 Pain in right knee: Secondary | ICD-10-CM | POA: Diagnosis not present

## 2024-05-27 DIAGNOSIS — I10 Essential (primary) hypertension: Secondary | ICD-10-CM | POA: Diagnosis not present

## 2024-05-27 DIAGNOSIS — E782 Mixed hyperlipidemia: Secondary | ICD-10-CM | POA: Diagnosis not present

## 2024-06-25 ENCOUNTER — Ambulatory Visit: Admitting: Orthopedic Surgery

## 2024-06-25 ENCOUNTER — Encounter: Payer: Self-pay | Admitting: Orthopedic Surgery

## 2024-06-25 ENCOUNTER — Other Ambulatory Visit (INDEPENDENT_AMBULATORY_CARE_PROVIDER_SITE_OTHER): Payer: Self-pay

## 2024-06-25 VITALS — BP 135/79 | HR 72 | Ht 64.0 in | Wt 224.0 lb

## 2024-06-25 DIAGNOSIS — M25561 Pain in right knee: Secondary | ICD-10-CM

## 2024-06-25 DIAGNOSIS — R202 Paresthesia of skin: Secondary | ICD-10-CM

## 2024-06-25 NOTE — Patient Instructions (Signed)
 We are referring you to [facility] from [facility] address is [address] [place] The phone number is [phone]  The office will call you with an appointment Dr. Alvester Morin

## 2024-06-25 NOTE — Progress Notes (Signed)
  Intake history:  BP 135/79   Pulse 72   Ht 5' 4 (1.626 m)   Wt 224 lb (101.6 kg)   BMI 38.45 kg/m  Body mass index is 38.45 kg/m.    WHAT ARE WE SEEING YOU FOR TODAY?   right knee(s)  How long has this bothered you? (DOI?DOS?WS?)  1 month(s) ago fell has had increased pain lateral knee since long duration of knee pain multiple surgeries years ago  Anticoag.  No  Diabetes No  Heart disease No  Hypertension Yes  SMOKING HX No  Kidney disease No  Any ALLERGIES ______________________________________________   Treatment:  Have you taken:  Tylenol Yes  Advil Yes  Had PT No  Had injection No  Other  _________________________

## 2024-06-25 NOTE — Progress Notes (Signed)
 Chief Complaint  Patient presents with   Knee Pain    Right     Sharon Pacheco was last seen in 2018.  She has a previous ACL reconstruction.  She has arthritis of the knee.  She was walking down a hill slipped her right knee hyperflexed she complains of some lateral knee pain and clicking.  She has gotten better over the last week or so.  Injury was about 4 weeks ago   Medical history reviewed   Examination of the right knee slight laxity in the AP position no collateral ligament instability joint line show mild to moderate tenderness there is no effusion she flexes the knee very well but she has limited extension she has a varus knee and she ambulates with a slight limp  DG Knee AP/LAT W/Sunrise Right Result Date: 06/25/2024 X-rays right knee.  Chronic pain with acute injury Patient hyperflexed her knee X-rays show old hardware from ACL surgery with 2 staples.  Joint degeneration is seen in all 3 compartments with severe narrowing of the medial compartment osteophytes in all 3 compartments subluxation of the joint subchondral sclerosis X-rays that were done in 2017 show that the arthritis has progressed No acute fracture grade IV arthritis with old hardware, 2 staples from ACL surgery     Assessment and plan  Encounter Diagnoses  Name Primary?   Right knee pain, unspecified chronicity Yes   Paresthesia of hand, bilateral     I think she just hyperflexed the knee nothing acute her x-rays show worsening arthritis.  She will probably need a knee replacement at some point but right now she continues with her modified activity gradual increase in overall workload on the knee she should improve over time  Will see her for carpal tunnel syndrome in a couple of weeks

## 2024-07-09 ENCOUNTER — Ambulatory Visit: Admitting: Physical Medicine and Rehabilitation

## 2024-07-09 DIAGNOSIS — R202 Paresthesia of skin: Secondary | ICD-10-CM | POA: Diagnosis not present

## 2024-07-09 DIAGNOSIS — M25532 Pain in left wrist: Secondary | ICD-10-CM | POA: Diagnosis not present

## 2024-07-09 DIAGNOSIS — R29898 Other symptoms and signs involving the musculoskeletal system: Secondary | ICD-10-CM

## 2024-07-09 DIAGNOSIS — M25531 Pain in right wrist: Secondary | ICD-10-CM

## 2024-07-09 NOTE — Progress Notes (Signed)
 BUE  Right handed 9/10 Numbness and tingling  Weakness

## 2024-07-11 NOTE — Procedures (Signed)
 EMG & NCV Findings: Evaluation of the left median motor and the right median motor nerves showed prolonged distal onset latency (L9.1, R5.8 ms), reduced amplitude (L2.4, R4.0 mV), and decreased conduction velocity (Elbow-Wrist, L40, R42 m/s).  The left median (across palm) sensory nerve showed no response (Wrist) and no response (Palm).  The right median (across palm) sensory nerve showed no response (Palm), prolonged distal peak latency (5.2 ms), and reduced amplitude (8.8 V).  All remaining nerves (as indicated in the following tables) were within normal limits.  Left vs. Right side comparison data for the median motor nerve indicates abnormal L-R latency difference (3.3 ms).  All remaining left vs. right side differences were within normal limits.    All examined muscles (as indicated in the following table) showed no evidence of electrical instability.    Impression: The above electrodiagnostic study is ABNORMAL and reveals evidence of a severe bilateral median nerve entrapment at the wrist (carpal tunnel syndrome) affecting sensory and motor components. There is no significant electrodiagnostic evidence of any other focal nerve entrapment, brachial plexopathy or cervical radiculopathy.   Recommendations: 1.  Follow-up with referring physician. 2.  Continue current management of symptoms. 3.  Suggest surgical evaluation.  ___________________________ Prentice Masters FAAPMR Board Certified, American Board of Physical Medicine and Rehabilitation    Nerve Conduction Studies Anti Sensory Summary Table   Stim Site NR Peak (ms) Norm Peak (ms) P-T Amp (V) Norm P-T Amp Site1 Site2 Delta-P (ms) Dist (cm) Vel (m/s) Norm Vel (m/s)  Left Median Acr Palm Anti Sensory (2nd Digit)  32.5C  Wrist *NR  <3.6  >10 Wrist Palm  0.0    Palm *NR  <2.0          Right Median Acr Palm Anti Sensory (2nd Digit)  30.5C  Wrist    *5.2 <3.6 *8.8 >10 Wrist Palm  0.0    Palm *NR  <2.0          Left Radial Anti Sensory  (Base 1st Digit)  31.8C  Wrist    2.3 <3.1 29.0  Wrist Base 1st Digit 2.3 0.0    Right Radial Anti Sensory (Base 1st Digit)  30.5C  Wrist    2.1 <3.1 16.8  Wrist Base 1st Digit 2.1 0.0    Left Ulnar Anti Sensory (5th Digit)  32.4C  Wrist    3.3 <3.7 17.6 >15.0 Wrist 5th Digit 3.3 14.0 42 >38  Right Ulnar Anti Sensory (5th Digit)  30.8C  Wrist    3.4 <3.7 23.2 >15.0 Wrist 5th Digit 3.4 14.0 41 >38   Motor Summary Table   Stim Site NR Onset (ms) Norm Onset (ms) O-P Amp (mV) Norm O-P Amp Site1 Site2 Delta-0 (ms) Dist (cm) Vel (m/s) Norm Vel (m/s)  Left Median Motor (Abd Poll Brev)  32C  Wrist    *9.1 <4.2 *2.4 >5 Elbow Wrist 4.2 17.0 *40 >50  Elbow    13.3  2.1         Right Median Motor (Abd Poll Brev)  30.7C  Wrist    *5.8 <4.2 *4.0 >5 Elbow Wrist 4.0 17.0 *42 >50  Elbow    9.8  4.3         Left Ulnar Motor (Abd Dig Min)  32C  Wrist    2.9 <4.2 9.0 >3 B Elbow Wrist 2.6 15.0 58 >53  B Elbow    5.5  7.9  A Elbow B Elbow 1.1 10.0 91 >53  A Elbow    6.6  8.7         Right Ulnar Motor (Abd Dig Min)  30.7C  Wrist    3.0 <4.2 8.1 >3 B Elbow Wrist 2.9 16.5 57 >53  B Elbow    5.9  7.8  A Elbow B Elbow 1.3 10.0 77 >53  A Elbow    7.2  7.7          EMG   Side Muscle Nerve Root Ins Act Fibs Psw Amp Dur Poly Recrt Int Bruna Comment  Left Abd Poll Brev Median C8-T1 Nml Nml Nml Nml Nml 0 Nml Nml   Left 1stDorInt Ulnar C8-T1 Nml Nml Nml Nml Nml 0 Nml Nml   Left PronatorTeres Median C6-7 Nml Nml Nml Nml Nml 0 Nml Nml   Left Biceps Musculocut C5-6 Nml Nml Nml Nml Nml 0 Nml Nml   Left Deltoid Axillary C5-6 Nml Nml Nml Nml Nml 0 Nml Nml     Nerve Conduction Studies Anti Sensory Left/Right Comparison   Stim Site L Lat (ms) R Lat (ms) L-R Lat (ms) L Amp (V) R Amp (V) L-R Amp (%) Site1 Site2 L Vel (m/s) R Vel (m/s) L-R Vel (m/s)  Median Acr Palm Anti Sensory (2nd Digit)  32.5C  Wrist  *5.2   *8.8  Wrist Palm     Palm             Radial Anti Sensory (Base 1st Digit)  31.8C  Wrist 2.3  2.1 0.2 29.0 16.8 42.1 Wrist Base 1st Digit     Ulnar Anti Sensory (5th Digit)  32.4C  Wrist 3.3 3.4 0.1 17.6 23.2 24.1 Wrist 5th Digit 42 41 1   Motor Left/Right Comparison   Stim Site L Lat (ms) R Lat (ms) L-R Lat (ms) L Amp (mV) R Amp (mV) L-R Amp (%) Site1 Site2 L Vel (m/s) R Vel (m/s) L-R Vel (m/s)  Median Motor (Abd Poll Brev)  32C  Wrist *9.1 *5.8 *3.3 *2.4 *4.0 40.0 Elbow Wrist *40 *42 2  Elbow 13.3 9.8 3.5 2.1 4.3 51.2       Ulnar Motor (Abd Dig Min)  32C  Wrist 2.9 3.0 0.1 9.0 8.1 10.0 B Elbow Wrist 58 57 1  B Elbow 5.5 5.9 0.4 7.9 7.8 1.3 A Elbow B Elbow 91 77 14  A Elbow 6.6 7.2 0.6 8.7 7.7 11.5          Waveforms:

## 2024-07-13 ENCOUNTER — Encounter: Payer: Self-pay | Admitting: Orthopedic Surgery

## 2024-07-19 ENCOUNTER — Encounter: Payer: Self-pay | Admitting: Physical Medicine and Rehabilitation

## 2024-07-19 NOTE — Progress Notes (Signed)
 Sharon Pacheco - 68 y.o. female MRN [id number]  Date of birth: Feb 10, 1956  Office Visit Note: Visit Date: 07/09/2024 PCP: Dow Longs, PA-C Referred by: Margrette Taft BRAVO, MD  Subjective: Chief Complaint  Patient presents with   Right Wrist - Pain, Weakness, Numbness   Left Wrist - Pain, Numbness, Weakness   Right Hand - Numbness, Pain, Weakness   Left Hand - Numbness, Pain, Weakness   HPI: Sharon Pacheco is a 68 y.o. female who comes in today at the request of Dr. Taft Margrette for evaluation and management of chronic, worsening and severe pain, numbness and tingling in the Bilateral upper extremities.  Patient is Right hand dominant.  She reports chronic several month history of worsening right more than left pain numbness and tingling in both hands.  She reports a lot of tingling numbness and weakness particularly in the right hand with manipulating objects and trying to do things.  She tries to use her hands quite a bit and just has a difficult time.  She has a lot of numbness and inability to use the hands for dexterity.  She gets a lot of nocturnal complaints.  We really talked as she has had some nocturnal complaints for quite some time.  She rates her pain as 9 out of 10.  She has not responded to medication and time excetra.  She is not diabetic.  She does have some neck pain but no frank radicular pain down the arms.  No history of polyneuropathy.  No thyroid  disease.  She went in to see Dr. Margrette evidently for her knee.  I do not see any much in the way of any comments on her hands regarding prior treatment etc. and she reports to me that she really has not had much prior treatment.  She has not had any prior electrodiagnostic studies.   I spent more than 30 minutes speaking face-to-face with the patient with 50% of the time in counseling and discussing coordination of care.       Review of Systems  Musculoskeletal:  Positive for joint pain and neck pain.   Neurological:  Positive for tingling and focal weakness.  All other systems reviewed and are negative.  Otherwise per HPI.  Assessment & Plan: Visit Diagnoses:    ICD-10-CM   1. Paresthesia of skin  R20.2 NCV with EMG (electromyography)    2. Pain in left wrist  M25.532     3. Pain in right wrist  M25.531     4. Weakness of both hands  R29.898        Plan: Impression: Clinically appears to be significant carpal tunnel syndrome and musculotendinous disorder.  Electrodiagnostic study performed today.  The above electrodiagnostic study is ABNORMAL and reveals evidence of a severe bilateral median nerve entrapment at the wrist (carpal tunnel syndrome) affecting sensory and motor components.   There is no significant electrodiagnostic evidence of any other focal nerve entrapment, brachial plexopathy or cervical radiculopathy.   Recommendations: 1.  Follow-up with referring physician. 2.  Continue current management of symptoms. 3.  Suggest surgical evaluation.  Meds & Orders: No orders of the defined types were placed in this encounter.   Orders Placed This Encounter  Procedures   NCV with EMG (electromyography)    Follow-up: Return for Taft Margrette, MD.   Procedures: No procedures performed  EMG & NCV Findings: Evaluation of the left median motor and the right median motor nerves showed prolonged distal onset latency (L9.1, R5.8  ms), reduced amplitude (L2.4, R4.0 mV), and decreased conduction velocity (Elbow-Wrist, L40, R42 m/s).  The left median (across palm) sensory nerve showed no response (Wrist) and no response (Palm).  The right median (across palm) sensory nerve showed no response (Palm), prolonged distal peak latency (5.2 ms), and reduced amplitude (8.8 V).  All remaining nerves (as indicated in the following tables) were within normal limits.  Left vs. Right side comparison data for the median motor nerve indicates abnormal L-R latency difference (3.3 ms).  All  remaining left vs. right side differences were within normal limits.    All examined muscles (as indicated in the following table) showed no evidence of electrical instability.    Impression: The above electrodiagnostic study is ABNORMAL and reveals evidence of a severe bilateral median nerve entrapment at the wrist (carpal tunnel syndrome) affecting sensory and motor components.   There is no significant electrodiagnostic evidence of any other focal nerve entrapment, brachial plexopathy or cervical radiculopathy.   Recommendations: 1.  Follow-up with referring physician. 2.  Continue current management of symptoms. 3.  Suggest surgical evaluation.  ___________________________ Prentice Masters FAAPMR Board Certified, American Board of Physical Medicine and Rehabilitation    Nerve Conduction Studies Anti Sensory Summary Table   Stim Site NR Peak (ms) Norm Peak (ms) P-T Amp (V) Norm P-T Amp Site1 Site2 Delta-P (ms) Dist (cm) Vel (m/s) Norm Vel (m/s)  Left Median Acr Palm Anti Sensory (2nd Digit)  32.5C  Wrist *NR  <3.6  >10 Wrist Palm  0.0    Palm *NR  <2.0          Right Median Acr Palm Anti Sensory (2nd Digit)  30.5C  Wrist    *5.2 <3.6 *8.8 >10 Wrist Palm  0.0    Palm *NR  <2.0          Left Radial Anti Sensory (Base 1st Digit)  31.8C  Wrist    2.3 <3.1 29.0  Wrist Base 1st Digit 2.3 0.0    Right Radial Anti Sensory (Base 1st Digit)  30.5C  Wrist    2.1 <3.1 16.8  Wrist Base 1st Digit 2.1 0.0    Left Ulnar Anti Sensory (5th Digit)  32.4C  Wrist    3.3 <3.7 17.6 >15.0 Wrist 5th Digit 3.3 14.0 42 >38  Right Ulnar Anti Sensory (5th Digit)  30.8C  Wrist    3.4 <3.7 23.2 >15.0 Wrist 5th Digit 3.4 14.0 41 >38   Motor Summary Table   Stim Site NR Onset (ms) Norm Onset (ms) O-P Amp (mV) Norm O-P Amp Site1 Site2 Delta-0 (ms) Dist (cm) Vel (m/s) Norm Vel (m/s)  Left Median Motor (Abd Poll Brev)  32C  Wrist    *9.1 <4.2 *2.4 >5 Elbow Wrist 4.2 17.0 *40 >50  Elbow    13.3  2.1          Right Median Motor (Abd Poll Brev)  30.7C  Wrist    *5.8 <4.2 *4.0 >5 Elbow Wrist 4.0 17.0 *42 >50  Elbow    9.8  4.3         Left Ulnar Motor (Abd Dig Min)  32C  Wrist    2.9 <4.2 9.0 >3 B Elbow Wrist 2.6 15.0 58 >53  B Elbow    5.5  7.9  A Elbow B Elbow 1.1 10.0 91 >53  A Elbow    6.6  8.7         Right Ulnar Motor (Abd Dig Min)  30.7C  Wrist  3.0 <4.2 8.1 >3 B Elbow Wrist 2.9 16.5 57 >53  B Elbow    5.9  7.8  A Elbow B Elbow 1.3 10.0 77 >53  A Elbow    7.2  7.7          EMG   Side Muscle Nerve Root Ins Act Fibs Psw Amp Dur Poly Recrt Int Bruna Comment  Left Abd Poll Brev Median C8-T1 Nml Nml Nml Nml Nml 0 Nml Nml   Left 1stDorInt Ulnar C8-T1 Nml Nml Nml Nml Nml 0 Nml Nml   Left PronatorTeres Median C6-7 Nml Nml Nml Nml Nml 0 Nml Nml   Left Biceps Musculocut C5-6 Nml Nml Nml Nml Nml 0 Nml Nml   Left Deltoid Axillary C5-6 Nml Nml Nml Nml Nml 0 Nml Nml     Nerve Conduction Studies Anti Sensory Left/Right Comparison   Stim Site L Lat (ms) R Lat (ms) L-R Lat (ms) L Amp (V) R Amp (V) L-R Amp (%) Site1 Site2 L Vel (m/s) R Vel (m/s) L-R Vel (m/s)  Median Acr Palm Anti Sensory (2nd Digit)  32.5C  Wrist  *5.2   *8.8  Wrist Palm     Palm             Radial Anti Sensory (Base 1st Digit)  31.8C  Wrist 2.3 2.1 0.2 29.0 16.8 42.1 Wrist Base 1st Digit     Ulnar Anti Sensory (5th Digit)  32.4C  Wrist 3.3 3.4 0.1 17.6 23.2 24.1 Wrist 5th Digit 42 41 1   Motor Left/Right Comparison   Stim Site L Lat (ms) R Lat (ms) L-R Lat (ms) L Amp (mV) R Amp (mV) L-R Amp (%) Site1 Site2 L Vel (m/s) R Vel (m/s) L-R Vel (m/s)  Median Motor (Abd Poll Brev)  32C  Wrist *9.1 *5.8 *3.3 *2.4 *4.0 40.0 Elbow Wrist *40 *42 2  Elbow 13.3 9.8 3.5 2.1 4.3 51.2       Ulnar Motor (Abd Dig Min)  32C  Wrist 2.9 3.0 0.1 9.0 8.1 10.0 B Elbow Wrist 58 57 1  B Elbow 5.5 5.9 0.4 7.9 7.8 1.3 A Elbow B Elbow 91 77 14  A Elbow 6.6 7.2 0.6 8.7 7.7 11.5          Waveforms:                      Clinical History: No specialty comments available.   She reports that she has never smoked. She has never used smokeless tobacco. No results for input(s): HGBA1C, LABURIC in the last 8760 hours.  Objective:  VS:  HT:    WT:   BMI:     BP:   HR: bpm  TEMP: ( )  RESP:  Physical Exam Vitals and nursing note reviewed.  Constitutional:      General: She is not in acute distress.    Appearance: Normal appearance. She is well-developed. She is obese. She is not ill-appearing.  HENT:     Head: Normocephalic and atraumatic.  Eyes:     Conjunctiva/sclera: Conjunctivae normal.     Pupils: Pupils are equal, round, and reactive to light.  Cardiovascular:     Rate and Rhythm: Normal rate.     Pulses: Normal pulses.  Pulmonary:     Effort: Pulmonary effort is normal.  Musculoskeletal:        General: Tenderness present. No swelling or deformity.     Right lower leg: No edema.  Left lower leg: No edema.     Comments: Inspection reveals no atrophy of the bilateral APB or FDI or hand intrinsics. There is no swelling, color changes, allodynia or dystrophic changes. There is 5 out of 5 strength in the bilateral wrist extension, finger abduction and long finger flexion.  There is decreased sensation to light touch in the median nerve distribution bilaterally.  There is a negative Tinel's test at the bilateral wrist and elbow. There is a positive Phalen's test bilaterally. There is a negative Hoffmann's test bilaterally.  Skin:    General: Skin is warm and dry.     Findings: No erythema or rash.  Neurological:     General: No focal deficit present.     Mental Status: She is alert and oriented to person, place, and time.     Cranial Nerves: No cranial nerve deficit.     Sensory: Sensory deficit present.     Motor: No weakness or abnormal muscle tone.     Coordination: Coordination normal.     Gait: Gait normal.  Psychiatric:        Mood and Affect: Mood normal.        Behavior:  Behavior normal.     Ortho Exam  Imaging: No results found.  Past Medical/Family/Surgical/Social History: Medications & Allergies reviewed per EMR, new medications updated. Patient Active Problem List   Diagnosis Date Noted   Rotator cuff disorder, left 11/25/2022   Limitation due to disability 12/02/2012   High cholesterol 02/24/2012   Benign essential hypertension 06/17/2011   Depression 06/17/2011   Past Medical History:  Diagnosis Date   Depression    Hypertension    History reviewed. No pertinent family history. Past Surgical History:  Procedure Laterality Date   FOOT SURGERY     KNEE SURGERY Bilateral    4 surgeries right knee 2 surgeries left knee   Social History   Occupational History   Not on file  Tobacco Use   Smoking status: Never   Smokeless tobacco: Never  Vaping Use   Vaping status: Never Used  Substance and Sexual Activity   Alcohol  use: No   Drug use: No   Sexual activity: Never

## 2024-08-02 DIAGNOSIS — J449 Chronic obstructive pulmonary disease, unspecified: Secondary | ICD-10-CM | POA: Insufficient documentation

## 2024-08-05 ENCOUNTER — Ambulatory Visit: Admitting: Orthopedic Surgery

## 2024-08-11 ENCOUNTER — Ambulatory Visit (INDEPENDENT_AMBULATORY_CARE_PROVIDER_SITE_OTHER): Admitting: Orthopedic Surgery

## 2024-08-11 ENCOUNTER — Encounter: Payer: Self-pay | Admitting: Orthopedic Surgery

## 2024-08-11 DIAGNOSIS — Z01818 Encounter for other preprocedural examination: Secondary | ICD-10-CM

## 2024-08-11 DIAGNOSIS — G5601 Carpal tunnel syndrome, right upper limb: Secondary | ICD-10-CM

## 2024-08-11 DIAGNOSIS — G5602 Carpal tunnel syndrome, left upper limb: Secondary | ICD-10-CM

## 2024-08-11 NOTE — Patient Instructions (Signed)
 Your surgery will be at [facility] by Dr Margrette  The hospital will contact you with a preoperative appointment to discuss Anesthesia.  Please arrive on time or 15 minutes early for the preoperative appointment, they have a very tight schedule if you are late or do not come in your surgery will be cancelled.  The phone number is [phone]. Please bring your medications with you for the appointment. They will tell you the arrival time and medication instructions when you have your preoperative evaluation. Do not wear nail polish the day of your surgery and if you take Phentermine you need to stop this medication ONE WEEK prior to your surgery. If you take Invokana, Farxiga, Jardiance, or Steglatro) - Hold 72 hours before the procedure.  If you take Ozempic,  Mounjaro, Bydureon or Trulicity do not take for 8 days before your surgery. If you take Victoza, Rybelsis, Saxenda or Adlyxi stop 24 hours before the procedure.  Please arrive at the hospital 2 hours before procedure if scheduled at 9:30 or later in the day or at the time the nurse tells you at your preoperative visit.   If you have my chart do not use the time given in my chart use the time given to you by the nurse during your preoperative visit.   Your surgery  time may change. Please be available for phone calls the day of your surgery and the day before. The Short Stay department may need to discuss changes about your surgery time. Not reaching the you could lead to procedure delays and possible cancellation.  You must have a ride home and someone to stay with you for 24 to 48 hours. The person taking you home will receive and sign for the your discharge instructions.  Please be prepared to give your support person's name and telephone number to Central Registration. Dr Margrette will need that name and phone number post procedure.

## 2024-08-11 NOTE — Progress Notes (Signed)
   Chief Complaint  Patient presents with   Results    EMG review    68 yo bilateral carpal tunnel like syndrome presents for reevaluation after EMG nerve conduction studies  The study show abnormal EMG and nerve conduction studies with bilateral severe carpal tunnel syndrome  See report for details   The patient would like to proceed with left carpal tunnel release and then 2 weeks later right carpal tunnel release and then get back to fishing if possible   Encounter Diagnoses  Name Primary?   Carpal tunnel syndrome, left upper limb Yes   Carpal tunnel syndrome, right upper limb    Pre-op evaluation    The procedure has been fully reviewed with the patient; The risks and benefits of surgery have been discussed and explained and understood. Alternative treatment has also been reviewed, questions were encouraged and answered. The postoperative plan is also been reviewed.

## 2024-08-12 NOTE — Patient Instructions (Signed)
 Sharon Pacheco  08/12/2024     @PREFPERIOPPHARMACY @   Your procedure is scheduled on  08/17/2024.   Report to [facility] at  [phone]  A.M.   Call this number if you have problems the morning of surgery:  [phone]  If you experience any cold or flu symptoms such as cough, fever, chills, shortness of breath, etc. between now and your scheduled surgery, please notify us  at the above number.   Remember:  Do not eat after midnight.   You may drink clear liquids until 0455 am on 08/17/2024.     Clear liquids allowed are:                    Water, Juice (No red color; non-citric and without pulp; diabetics please choose diet or no sugar options), Carbonated beverages (diabetics please choose diet or no sugar options), Clear Tea (No creamer, milk, or cream, including half & half and powdered creamer), Black Coffee Only (No creamer, milk or cream, including half & half and powdered creamer), and Clear Sports drink (No red color; diabetics please choose diet or no sugar options)    Take these medicines the morning of surgery with A SIP OF WATER                          bupropion, duloxetine, levothyroxine.    Do not wear jewelry, make-up or nail polish, including gel polish,  artificial nails, or any other type of covering on natural nails (fingers and  toes).  Do not wear lotions, powders, or perfumes, or deodorant.  Do not shave 48 hours prior to surgery.  Men may shave face and neck.  Do not bring valuables to the hospital.  [facility] is not responsible for any belongings or valuables.  Contacts, dentures or bridgework may not be worn into surgery.  Leave your suitcase in the car.  After surgery it may be brought to your room.  For patients admitted to the hospital, discharge time will be determined by your treatment team.  Patients discharged the day of surgery will not be allowed to drive home and must have someone with them for 24 hours.    Special  instructions:   DO NOT smoke tobacco or vape for 24 hours before your procedure  Please read over the following fact sheets that you were given. Coughing and Deep Breathing, Surgical Site Infection Prevention, Anesthesia Post-op Instructions, and Care and Recovery After Surgery          Open Carpal Tunnel Release: What to Know After After open carpal tunnel release surgery, it's common to have pain, swelling, bruising, and stiffness in the wrist. Follow these instructions at home: Medicines Take your medicines only as told. You may need to take steps to help treat or prevent trouble pooping (constipation), such as: Taking medicines to help you poop. Eating foods high in fiber, like beans, whole grains, and fresh fruits and vegetables. Drinking more fluids as told. If you have a splint or brace that can be taken off: Wear the splint or brace as told. Take it off only if your health care provider says you can. Check the skin around it every day. Tell your provider if you see problems. Loosen the splint or brace if your fingers tingle, are numb, or turn cold and blue. Keep the splint or brace clean. If the splint or brace isn't waterproof: Do  not let it get wet. Cover it when you take a bath or shower. Use a cover that doesn't let any water in. Bathing Do not take baths, swim, or use a hot tub until you're told it's OK. Ask if you can shower. Keep your bandage dry until your provider says it can be removed. Cover it when you take a bath or shower. Use a cover that doesn't let any water in. Wound care  Take care of your cut from surgery as told. Make sure you: Wash your hands with soap and water for at least 20 seconds before and after you change your bandage. If you can't use soap and water, use hand sanitizer. Change your bandage. Leave stitches or skin glue alone. Leave tape strips alone unless you're told to take them off. You may trim the edges of the tape strips if they curl  up. Check the area around your cut from surgery every day for signs of infection. Check for: Redness, swelling, or pain. Fluid or blood. Warmth. Pus or a bad smell. Pain Management  Use ice or an ice pack as told. If you have a splint or brace that you can take off, remove it only as told. Place a towel between your skin and the ice. Leave the ice on for 20 minutes, 2-3 times a day. If your skin turns red, take off the ice right away to prevent skin damage. The risk of damage is higher if you can't feel pain, heat, or cold. Move your fingers often to avoid stiffness and swelling. Raise your wrist above the level of your heart while you're sitting or lying down. Use pillows as needed. Activity Ask when it's safe to drive if you have a splint or brace on your hand. Do not lift with your affected hand until you're told it's OK. Avoid pulling and pushing with the injured arm. Do exercises as told. Ask what things are safe for you to do at home. Ask when you can go back to work or school. General instructions Do not smoke, vape or use nicotine or tobacco. Doing this can slow down healing. Keep all follow-up visits. You'll need to have your hand checked after surgery. Your provider may give you more instructions. Make sure you know what you can and can't do. Contact a health care provider if: You have signs of infection. You have a fever. You have pain that doesn't get better with medicine. Your carpal tunnel symptoms don't go away after 2 months. Your carpal tunnel symptoms go away and then come back. You have numbness that's getting worse. Get help right away if: Your fingers or hand turn cool or pale, or they change color. They may turn blue or grey. You aren't able to move your fingers. You lose feeling in your fingers, hand, or arm. These symptoms may be an emergency. Call 911 right away. Do not wait to see if the symptoms will go away. Do not drive yourself to the  hospital. This information is not intended to replace advice given to you by your health care provider. Make sure you discuss any questions you have with your health care provider. Document Revised: 08/11/2023 Document Reviewed: 08/11/2023 Elsevier Patient Education  2024 [employer]How to Use Chlorhexidine at Home in the Shower Chlorhexidine gluconate (CHG) is a germ-killing (antiseptic) wash that's used to clean the skin. It can get rid of the germs that normally live on the skin and can keep them away for about 24 hours. If  you're having surgery, you may be told to shower with CHG at home the night before surgery. This can help lower your risk for infection. To use CHG wash in the shower, follow the steps below. Supplies needed: CHG body wash. Clean washcloth. Clean towel. How to use CHG in the shower Follow these steps unless you're told to use CHG in a different way: Start the shower. Use your normal soap and shampoo to wash your face and hair. Turn off the shower or move out of the shower stream. Pour CHG onto a clean washcloth. Do not use any type of brush or rough sponge. Start at your neck, washing your body down to your toes. Make sure you: Wash the part of your body where the surgery will be done for at least 1 minute. Do not scrub. Do not use CHG on your head or face unless your health care provider tells you to. If it gets into your ears or eyes, rinse them well with water. Do not wash your genitals with CHG. Wash your back and under your arms. Make sure to wash skin folds. Let the CHG sit on your skin for 1-2 minutes or as long as told. Rinse your entire body in the shower, including all body creases and folds. Turn off the shower. Dry off with a clean towel. Do not put anything on your skin afterward, such as powder, lotion, or perfume. Put on clean clothes or pajamas. If it's the night before surgery, sleep in clean sheets. General tips Use CHG only as told, and  follow the instructions on the label. Use the full amount of CHG as told. This is often one bottle. Do not smoke and stay away from flames after using CHG. Your skin may feel sticky after using CHG. This is normal. The sticky feeling will go away as the CHG dries. Do not use CHG: If you have a chlorhexidine allergy or have reacted to chlorhexidine in the past. On open wounds or areas of skin that have broken skin, cuts, or scrapes. On babies younger than 73 months of age. Contact a health care provider if: You have questions about using CHG. Your skin gets irritated or itchy. You have a rash after using CHG. You swallow any CHG. Call your local poison control center [phone] in the [place] Your eyes itch badly, or they become very red or swollen. Your hearing changes. You have trouble seeing. If you can't reach your provider, go to an urgent care or emergency room. Do not drive yourself. Get help right away if: You have swelling or tingling in your mouth or throat. You make high-pitched whistling sounds when you breathe, most often when you breathe out (wheeze). You have trouble breathing. These symptoms may be an emergency. Call 911 right away. Do not wait to see if the symptoms will go away. Do not drive yourself to the hospital. This information is not intended to replace advice given to you by your health care provider. Make sure you discuss any questions you have with your health care provider. Document Revised: 05/27/2023 Document Reviewed: 05/23/2022 Elsevier Patient Education  2024 Else[employer] Inc.How to Use an Incentive Spirometer An incentive spirometer is a tool that measures how well you are filling your lungs with each breath. Learning to take long, deep breaths using this tool can help you keep your lungs clear and active. This may help to reverse or lessen your chance of developing breathing (pulmonary) problems, especially infection. You may be asked to  use a  spirometer: After a surgery. If you have a lung problem or a history of smoking. After a long period of time when you have been unable to move or be active. If the spirometer includes an indicator to show the highest number that you have reached, your health care provider or respiratory therapist will help you set a goal. Keep a log of your progress as told by your health care provider. What are the risks? Breathing too quickly may cause dizziness or cause you to pass out. Take your time so you do not get dizzy or light-headed. If you are in pain, you may need to take pain medicine before doing incentive spirometry. It is harder to take a deep breath if you are having pain. How to use your incentive spirometer  Sit up on the edge of your bed or on a chair. Hold the incentive spirometer so that it is in an upright position. Before you use the spirometer, breathe out normally. Place the mouthpiece in your mouth. Make sure your lips are closed tightly around it. Breathe in slowly and as deeply as you can through your mouth, causing the piston or the ball to rise toward the top of the chamber. Hold your breath for 3-5 seconds, or for as long as possible. If the spirometer includes a coach indicator, use this to guide you in breathing. Slow down your breathing if the indicator goes above the marked areas. Remove the mouthpiece from your mouth and breathe out normally. The piston or ball will return to the bottom of the chamber. Rest for a few seconds, then repeat the steps 10 or more times. Take your time and take a few normal breaths between deep breaths so that you do not get dizzy or light-headed. Do this every 1-2 hours when you are awake. If the spirometer includes a goal marker to show the highest number you have reached (best effort), use this as a goal to work toward during each repetition. After each set of 10 deep breaths, cough a few times. This will help to make sure that your lungs are  clear. If you have an incision on your chest or abdomen from surgery, place a pillow or a rolled-up towel firmly against the incision when you cough. This can help to reduce pain while taking deep breaths and coughing. General tips When you are able to get out of bed: Walk around often. Continue to take deep breaths and cough in order to clear your lungs. Keep using the incentive spirometer until your health care provider says it is okay to stop using it. If you have been in the hospital, you may be told to keep using the spirometer at home. Contact a health care provider if: You are having difficulty using the spirometer. You have trouble using the spirometer as often as instructed. Your pain medicine is not giving enough relief for you to use the spirometer as told. You have a fever. Get help right away if: You develop shortness of breath. You develop a cough with bloody mucus from the lungs. You have fluid or blood coming from an incision site after you cough. Summary An incentive spirometer is a tool that can help you learn to take long, deep breaths to keep your lungs clear and active. You may be asked to use a spirometer after a surgery, if you have a lung problem or a history of smoking, or if you have been inactive for a long period of time.  Use your incentive spirometer as instructed every 1-2 hours while you are awake. If you have an incision on your chest or abdomen, place a pillow or a rolled-up towel firmly against your incision when you cough. This will help to reduce pain. Get help right away if you have shortness of breath, you cough up bloody mucus, or blood comes from your incision when you cough. This information is not intended to replace advice given to you by your health care provider. Make sure you discuss any questions you have with your health care provider. Document Revised: 09/19/2023 Document Reviewed: 09/19/2023 Elsevier Patient Education  2024 Elsevier  Inc.General Anesthesia, Adult, Care After The following information offers guidance on how to care for yourself after your procedure. Your health care provider may also give you more specific instructions. If you have problems or questions, contact your health care provider. What can I expect after the procedure? After the procedure, it is common for people to: Have pain or discomfort at the IV site. Have nausea or vomiting. Have a sore throat or hoarseness. Have trouble concentrating. Feel cold or chills. Feel weak, sleepy, or tired (fatigue). Have soreness and body aches. These can affect parts of the body that were not involved in surgery. Follow these instructions at home: For the time period you were told by your health care provider:  Rest. Do not participate in activities where you could fall or become injured. Do not drive or use machinery. Do not drink alcohol . Do not take sleeping pills or medicines that cause drowsiness. Do not make important decisions or sign legal documents. Do not take care of children on your own. General instructions Drink enough fluid to keep your urine pale yellow. If you have sleep apnea, surgery and certain medicines can increase your risk for breathing problems. Follow instructions from your health care provider about wearing your sleep device: Anytime you are sleeping, including during daytime naps. While taking prescription pain medicines, sleeping medicines, or medicines that make you drowsy. Return to your normal activities as told by your health care provider. Ask your health care provider what activities are safe for you. Take over-the-counter and prescription medicines only as told by your health care provider. Do not use any products that contain nicotine or tobacco. These products include cigarettes, chewing tobacco, and vaping devices, such as e-cigarettes. These can delay incision healing after surgery. If you need help quitting, ask your  health care provider. Contact a health care provider if: You have nausea or vomiting that does not get better with medicine. You vomit every time you eat or drink. You have pain that does not get better with medicine. You cannot urinate or have bloody urine. You develop a skin rash. You have a fever. Get help right away if: You have trouble breathing. You have chest pain. You vomit blood. These symptoms may be an emergency. Get help right away. Call 911. Do not wait to see if the symptoms will go away. Do not drive yourself to the hospital. Summary After the procedure, it is common to have a sore throat, hoarseness, nausea, vomiting, or to feel weak, sleepy, or fatigue. For the time period you were told by your health care provider, do not drive or use machinery. Get help right away if you have difficulty breathing, have chest pain, or vomit blood. These symptoms may be an emergency. This information is not intended to replace advice given to you by your health care provider. Make sure you discuss any  questions you have with your health care provider. Document Revised: 02/08/2022 Document Reviewed: 02/08/2022 Elsevier Patient Education  2024 [employer].

## 2024-08-13 ENCOUNTER — Encounter (HOSPITAL_COMMUNITY)
Admission: RE | Admit: 2024-08-13 | Discharge: 2024-08-13 | Disposition: A | Discharge status: Home / Self Care | Source: Ambulatory Visit | Attending: Orthopedic Surgery | Admitting: Orthopedic Surgery

## 2024-08-13 ENCOUNTER — Encounter (HOSPITAL_COMMUNITY): Payer: Self-pay

## 2024-08-13 VITALS — BP 120/78 | HR 71 | Resp 18 | Ht 64.0 in | Wt 224.0 lb

## 2024-08-13 DIAGNOSIS — R7303 Prediabetes: Secondary | ICD-10-CM | POA: Insufficient documentation

## 2024-08-13 DIAGNOSIS — I10 Essential (primary) hypertension: Secondary | ICD-10-CM | POA: Insufficient documentation

## 2024-08-13 DIAGNOSIS — Z01818 Encounter for other preprocedural examination: Secondary | ICD-10-CM | POA: Insufficient documentation

## 2024-08-13 DIAGNOSIS — G5602 Carpal tunnel syndrome, left upper limb: Secondary | ICD-10-CM | POA: Diagnosis not present

## 2024-08-13 DIAGNOSIS — Z6838 Body mass index (BMI) 38.0-38.9, adult: Secondary | ICD-10-CM | POA: Insufficient documentation

## 2024-08-13 DIAGNOSIS — Z01810 Encounter for preprocedural cardiovascular examination: Secondary | ICD-10-CM | POA: Diagnosis present

## 2024-08-13 DIAGNOSIS — Z01812 Encounter for preprocedural laboratory examination: Secondary | ICD-10-CM | POA: Diagnosis present

## 2024-08-13 LAB — CBC WITH DIFFERENTIAL/PLATELET
Abs Immature Granulocytes: 0.02 K/uL (ref 0.00–0.07)
Basophils Absolute: 0 K/uL (ref 0.0–0.1)
Basophils Relative: 1 %
Eosinophils Absolute: 0.3 K/uL (ref 0.0–0.5)
Eosinophils Relative: 4 %
HCT: 40.4 % (ref 36.0–46.0)
Hemoglobin: 13 g/dL (ref 12.0–15.0)
Immature Granulocytes: 0 %
Lymphocytes Relative: 28 %
Lymphs Abs: 1.8 K/uL (ref 0.7–4.0)
MCH: 31.3 pg (ref 26.0–34.0)
MCHC: 32.2 g/dL (ref 30.0–36.0)
MCV: 97.3 fL (ref 80.0–100.0)
Monocytes Absolute: 0.6 K/uL (ref 0.1–1.0)
Monocytes Relative: 10 %
Neutro Abs: 3.6 K/uL (ref 1.7–7.7)
Neutrophils Relative %: 57 %
Platelets: 230 K/uL (ref 150–400)
RBC: 4.15 MIL/uL (ref 3.87–5.11)
RDW: 12.8 % (ref 11.5–15.5)
WBC: 6.4 K/uL (ref 4.0–10.5)
nRBC: 0 % (ref 0.0–0.2)

## 2024-08-13 LAB — BASIC METABOLIC PANEL WITH GFR
Anion gap: 10 (ref 5–15)
BUN: 16 mg/dL (ref 8–23)
CO2: 23 mmol/L (ref 22–32)
Calcium: 8.9 mg/dL (ref 8.9–10.3)
Chloride: 105 mmol/L (ref 98–111)
Creatinine, Ser: 0.83 mg/dL (ref 0.44–1.00)
GFR, Estimated: >60 mL/min (ref >60)
Glucose, Bld: 86 mg/dL (ref 70–99)
Potassium: 3.9 mmol/L (ref 3.5–5.1)
Sodium: 138 mmol/L (ref 135–145)

## 2024-08-13 LAB — HEMOGLOBIN A1C
Hgb A1c MFr Bld: 5.6 % (ref 4.8–5.6)
Mean Plasma Glucose: 114.02 mg/dL

## 2024-08-16 NOTE — H&P (Signed)
 Sharon Pacheco is an 68 y.o. female.   Chief Complaint: Pain and paresthesias left upper extremity HPI: This is a 68 year old female with pain and paresthesias of her left upper extremity.  She had a positive nerve conduction study with EMGs and it show she has severe bilateral carpal tunnel syndrome she has chosen to get the left side done first and presents for left carpal tunnel release  Past Medical History:  Diagnosis Date   Depression    Hypertension     Past Surgical History:  Procedure Laterality Date   FOOT SURGERY     KNEE SURGERY Bilateral    4 surgeries right knee 2 surgeries left knee    No family history on file. Social History:  reports that she has never smoked. She has never used smokeless tobacco. She reports that she does not drink alcohol  and does not use drugs.  Allergies: No Known Allergies  No medications prior to admission.    No results found for this or any previous visit (from the past 48 hours). No results found.  Review of Systems  No symptoms related to her carpal tunnel syndrome she has some other joint issues previous knee surgery including ACL reconstruction  There were no vitals taken for this visit. Physical Exam   General appearance patient is well-groomed well-nourished she is overweight  She is oriented to person place and time  She has a normal mood and affect  Her gait does not require support  Inspection of her left upper extremity shows no atrophy full range of motion of the wrist and hand with normal flexion of all the digits.  Her grip strength seems normal the wrist is stable her skin is intact warm and dry her pulse and perfusion are normal good capillary refill there is no lymphadenopathy in the left upper extremity and her sensory findings include decreased sensation in the thumb index and long finger and part of the ring finger.  The ulnar nerve innervated area is normal   Assessment and plan :  EMG & NCV  Findings: Evaluation of the left median motor and the right median motor nerves showed prolonged distal onset latency (L9.1, R5.8 ms), reduced amplitude (L2.4, R4.0 mV), and decreased conduction velocity (Elbow-Wrist, L40, R42 m/s).  The left median (across palm) sensory nerve showed no response (Wrist) and no response (Palm).  The right median (across palm) sensory nerve showed no response (Palm), prolonged distal peak latency (5.2 ms), and reduced amplitude (8.8 V).  All remaining nerves (as indicated in the following tables) were within normal limits.  Left vs. Right side comparison data for the median motor nerve indicates abnormal L-R latency difference (3.3 ms).  All remaining left vs. right side differences were within normal limits.     All examined muscles (as indicated in the following table) showed no evidence of electrical instability.     Impression: The above electrodiagnostic study is ABNORMAL and reveals evidence of a severe bilateral median nerve entrapment at the wrist (carpal tunnel syndrome) affecting sensory and motor components.    There is no significant electrodiagnostic evidence of any other focal nerve entrapment, brachial plexopathy or cervical radiculopathy.    Recommendations: 1.  Follow-up with referring physician. 2.  Continue current management of symptoms. 3.  Suggest surgical evaluation.   ___________________________ Prentice Eldonna BETTERS Board Certified, American Board of Physical Medicine and Rehabilitation     Carpal tunnel syndrome  Left carpal tunnel open release  Taft Minerva, MD 08/16/2024, 2:00  PM

## 2024-08-17 ENCOUNTER — Encounter (HOSPITAL_COMMUNITY): Payer: Self-pay | Admitting: Orthopedic Surgery

## 2024-08-17 ENCOUNTER — Ambulatory Visit (HOSPITAL_COMMUNITY): Admitting: Anesthesiology

## 2024-08-17 ENCOUNTER — Other Ambulatory Visit: Payer: Self-pay

## 2024-08-17 ENCOUNTER — Ambulatory Visit (HOSPITAL_COMMUNITY)
Admission: RE | Admit: 2024-08-17 | Discharge: 2024-08-17 | Disposition: A | Discharge status: Home / Self Care | Attending: Orthopedic Surgery | Admitting: Orthopedic Surgery

## 2024-08-17 ENCOUNTER — Encounter (HOSPITAL_COMMUNITY)
Admission: RE | Disposition: A | Discharge status: Home / Self Care | Payer: Self-pay | Source: Home / Self Care | Attending: Orthopedic Surgery

## 2024-08-17 ENCOUNTER — Ambulatory Visit (HOSPITAL_BASED_OUTPATIENT_CLINIC_OR_DEPARTMENT_OTHER): Admitting: Anesthesiology

## 2024-08-17 DIAGNOSIS — G5603 Carpal tunnel syndrome, bilateral upper limbs: Secondary | ICD-10-CM | POA: Diagnosis not present

## 2024-08-17 DIAGNOSIS — E039 Hypothyroidism, unspecified: Secondary | ICD-10-CM | POA: Diagnosis not present

## 2024-08-17 DIAGNOSIS — G5602 Carpal tunnel syndrome, left upper limb: Secondary | ICD-10-CM | POA: Diagnosis not present

## 2024-08-17 DIAGNOSIS — I10 Essential (primary) hypertension: Secondary | ICD-10-CM | POA: Insufficient documentation

## 2024-08-17 DIAGNOSIS — I2510 Atherosclerotic heart disease of native coronary artery without angina pectoris: Secondary | ICD-10-CM | POA: Diagnosis not present

## 2024-08-17 DIAGNOSIS — J449 Chronic obstructive pulmonary disease, unspecified: Secondary | ICD-10-CM | POA: Diagnosis not present

## 2024-08-17 HISTORY — PX: CARPAL TUNNEL RELEASE: SHX101

## 2024-08-17 SURGERY — CARPAL TUNNEL RELEASE
Anesthesia: General | Site: Hand | Laterality: Left

## 2024-08-17 MED ORDER — DEXAMETHASONE SODIUM PHOSPHATE 10 MG/ML IJ SOLN
INTRAMUSCULAR | Status: AC
Start: 2024-08-17 — End: 2024-08-17
  Filled 2024-08-17: qty 1

## 2024-08-17 MED ORDER — FENTANYL CITRATE (PF) 100 MCG/2ML IJ SOLN
INTRAMUSCULAR | Status: DC | PRN
  Administered 2024-08-17: 25 ug via INTRAVENOUS
  Administered 2024-08-17: 50 ug via INTRAVENOUS
  Administered 2024-08-17: 25 ug via INTRAVENOUS
  Administered 2024-08-17: 50 ug via INTRAVENOUS

## 2024-08-17 MED ORDER — BUPIVACAINE HCL (PF) 0.5 % IJ SOLN
INTRAMUSCULAR | Status: AC
  Filled 2024-08-17: qty 30

## 2024-08-17 MED ORDER — OXYCODONE HCL 5 MG/5ML PO SOLN: 5.0000 mg | Freq: Once | ORAL | Status: DC | PRN

## 2024-08-17 MED ORDER — LACTATED RINGERS IV SOLN: INTRAVENOUS | Status: DC

## 2024-08-17 MED ORDER — DEXAMETHASONE SODIUM PHOSPHATE 10 MG/ML IJ SOLN
INTRAMUSCULAR | Status: DC | PRN
  Administered 2024-08-17: 8 mg via INTRAVENOUS

## 2024-08-17 MED ORDER — FENTANYL CITRATE PF 50 MCG/ML IJ SOSY: 25.0000 ug | PREFILLED_SYRINGE | INTRAMUSCULAR | Status: DC | PRN

## 2024-08-17 MED ORDER — ONDANSETRON HCL 4 MG/2ML IJ SOLN
INTRAMUSCULAR | Status: AC
  Filled 2024-08-17: qty 2

## 2024-08-17 MED ORDER — PROPOFOL 10 MG/ML IV BOLUS
INTRAVENOUS | Status: AC
  Filled 2024-08-17: qty 20

## 2024-08-17 MED ORDER — FENTANYL CITRATE (PF) 100 MCG/2ML IJ SOLN
INTRAMUSCULAR | Status: AC
  Filled 2024-08-17: qty 2

## 2024-08-17 MED ORDER — LIDOCAINE 2% (20 MG/ML) 5 ML SYRINGE
INTRAMUSCULAR | Status: DC | PRN
  Administered 2024-08-17: 40 mg via INTRAVENOUS

## 2024-08-17 MED ORDER — ONDANSETRON HCL 4 MG/2ML IJ SOLN
INTRAMUSCULAR | Status: DC | PRN
  Administered 2024-08-17: 4 mg via INTRAVENOUS

## 2024-08-17 MED ORDER — LACTATED RINGERS IV SOLN
INTRAVENOUS | Status: DC
Start: 2024-08-17 — End: 2024-08-17

## 2024-08-17 MED ORDER — PROPOFOL 10 MG/ML IV BOLUS
INTRAVENOUS | Status: DC | PRN
  Administered 2024-08-17: 100 mg via INTRAVENOUS

## 2024-08-17 MED ORDER — OXYCODONE HCL 5 MG PO TABS: 5.0000 mg | ORAL_TABLET | Freq: Once | ORAL | Status: DC | PRN

## 2024-08-17 MED ORDER — BUPIVACAINE HCL (PF) 0.5 % IJ SOLN
INTRAMUSCULAR | Status: DC | PRN
  Administered 2024-08-17: 10 mL

## 2024-08-17 MED ORDER — ONDANSETRON HCL 4 MG/2ML IJ SOLN: 4.0000 mg | Freq: Once | INTRAMUSCULAR | Status: DC | PRN

## 2024-08-17 MED ORDER — MIDAZOLAM HCL 2 MG/2ML IJ SOLN
INTRAMUSCULAR | Status: DC | PRN
  Administered 2024-08-17: 2 mg via INTRAVENOUS

## 2024-08-17 MED ORDER — CHLORHEXIDINE GLUCONATE 0.12 % MT SOLN: 15.0000 mL | Freq: Once | OROMUCOSAL | Status: DC

## 2024-08-17 MED ORDER — LIDOCAINE 2% (20 MG/ML) 5 ML SYRINGE
INTRAMUSCULAR | Status: AC
  Filled 2024-08-17: qty 5

## 2024-08-17 MED ORDER — CHLORHEXIDINE GLUCONATE 0.12 % MT SOLN
OROMUCOSAL | Status: AC
  Administered 2024-08-17: 15 mL via OROMUCOSAL
  Filled 2024-08-17: qty 15

## 2024-08-17 MED ORDER — ORAL CARE MOUTH RINSE: 15.0000 mL | Freq: Once | OROMUCOSAL | Status: DC

## 2024-08-17 MED ORDER — CHLORHEXIDINE GLUCONATE 0.12 % MT SOLN: 15.0000 mL | Freq: Once | OROMUCOSAL | Status: AC

## 2024-08-17 MED ORDER — MIDAZOLAM HCL 2 MG/2ML IJ SOLN
INTRAMUSCULAR | Status: AC
  Filled 2024-08-17: qty 2

## 2024-08-17 MED ORDER — CEFAZOLIN SODIUM-DEXTROSE 2-4 GM/100ML-% IV SOLN
2.0000 g | INTRAVENOUS | Status: AC
Start: 2024-08-17 — End: 2024-08-17
  Administered 2024-08-17: 2 g via INTRAVENOUS

## 2024-08-17 MED ORDER — CEFAZOLIN SODIUM-DEXTROSE 2-4 GM/100ML-% IV SOLN
INTRAVENOUS | Status: AC
  Filled 2024-08-17: qty 100

## 2024-08-17 MED ORDER — DEXAMETHASONE SODIUM PHOSPHATE 10 MG/ML IJ SOLN
INTRAMUSCULAR | Status: AC
  Filled 2024-08-17: qty 1

## 2024-08-17 MED ORDER — HYDROCODONE-ACETAMINOPHEN 5-325 MG PO TABS: 1.0000 | ORAL_TABLET | ORAL | 0 refills | Status: DC | PRN

## 2024-08-17 MED ORDER — 0.9 % SODIUM CHLORIDE (POUR BTL) OPTIME
TOPICAL | Status: DC | PRN
  Administered 2024-08-17: 1000 mL

## 2024-08-17 SURGICAL SUPPLY — 30 items
BANDAGE ESMARK 4X12 BL STRL LF (DISPOSABLE) ×1 IMPLANT
BLADE SURG 15 STRL LF DISP TIS (BLADE) ×1 IMPLANT
BNDG ELASTIC 2X5.8 VLCR NS LF (GAUZE/BANDAGES/DRESSINGS) IMPLANT
BNDG GAUZE DERMACEA FLUFF 4 (GAUZE/BANDAGES/DRESSINGS) IMPLANT
BNDG GAUZE ELAST 4 BULKY (GAUZE/BANDAGES/DRESSINGS) ×1 IMPLANT
CHLORAPREP W/TINT 26 (MISCELLANEOUS) ×1 IMPLANT
CLOTH BEACON ORANGE TIMEOUT ST (SAFETY) ×1 IMPLANT
COVER LIGHT HANDLE STERIS (MISCELLANEOUS) ×2 IMPLANT
CUFF TOURN SGL QUICK 18X4 (TOURNIQUET CUFF) ×1 IMPLANT
DRAPE HALF SHEET 40X57 (DRAPES) IMPLANT
ELECTRODE REM PT RTRN 9FT ADLT (ELECTROSURGICAL) ×1 IMPLANT
GAUZE SPONGE 4X4 12PLY STRL (GAUZE/BANDAGES/DRESSINGS) ×1 IMPLANT
GAUZE XEROFORM 1X8 LF (GAUZE/BANDAGES/DRESSINGS) ×1 IMPLANT
GLOVE BIOGEL PI IND STRL 7.0 (GLOVE) ×2 IMPLANT
GLOVE BIOGEL PI IND STRL 8.5 (GLOVE) ×1 IMPLANT
GLOVE SKINSENSE STRL SZ8.0 LF (GLOVE) ×1 IMPLANT
GOWN STRL REUS W/TWL LRG LVL3 (GOWN DISPOSABLE) ×1 IMPLANT
GOWN STRL REUS W/TWL XL LVL3 (GOWN DISPOSABLE) ×1 IMPLANT
KIT TURNOVER KIT A (KITS) ×1 IMPLANT
MANIFOLD NEPTUNE II (INSTRUMENTS) ×1 IMPLANT
NDL HYPO 21X1.5 SAFETY (NEEDLE) ×1 IMPLANT
NEEDLE HYPO 21X1.5 SAFETY (NEEDLE) ×1 IMPLANT
NS IRRIG 1000ML POUR BTL (IV SOLUTION) ×1 IMPLANT
PACK BASIC LIMB (CUSTOM PROCEDURE TRAY) ×1 IMPLANT
PAD ARMBOARD POSITIONER FOAM (MISCELLANEOUS) ×1 IMPLANT
POSITIONER HAND ALUMI XLG (MISCELLANEOUS) ×1 IMPLANT
POSITIONER HEAD 8X9X4 ADT (SOFTGOODS) ×1 IMPLANT
SET BASIN LINEN APH (SET/KITS/TRAYS/PACK) ×1 IMPLANT
SUT 3-0 BLK 1X30 PSL (SUTURE) ×1 IMPLANT
SYR CONTROL 10ML LL (SYRINGE) ×1 IMPLANT

## 2024-08-17 NOTE — Anesthesia Procedure Notes (Signed)
 Procedure Name: LMA Insertion Date/Time: 08/17/2024 8:49 AM  Performed by: Augusta Daved SAILOR, CRNAPre-anesthesia Checklist: Patient identified, Emergency Drugs available, Suction available and Patient being monitored Patient Re-evaluated:Patient Re-evaluated prior to induction Oxygen Delivery Method: Circle System Utilized Preoxygenation: Pre-oxygenation with 100% oxygen Induction Type: IV induction Ventilation: Mask ventilation without difficulty LMA: LMA inserted LMA Size: 4.0 Number of attempts: 1 Placement Confirmation: positive ETCO2 Tube secured with: Tape Dental Injury: Teeth and Oropharynx as per pre-operative assessment

## 2024-08-17 NOTE — Brief Op Note (Addendum)
 08/17/2024  9:30 AM  PATIENT:  Sharon Pacheco  68 y.o. female  PRE-OPERATIVE DIAGNOSIS:  left carpal tunnel release  POST-OPERATIVE DIAGNOSIS:  left carpal tunnel release  PROCEDURE:  Procedure(s): CARPAL TUNNEL RELEASE (Left)  SURGEON:  Surgeons and Role:    DEWAINE Margrette Taft FORBES, MD - Primary  Findings Color dull Shape normal Carpal tunnel contents normal  Details of the procedure  I saw the patient in the preop area I confirmed the surgical site is left wrist and marked my initials over the surgical site.  I performed a thorough chart review.  The patient was taken the operating for general anesthesia in supine position  Her left upper extremities prepped and draped sterilely with a tourniquet applied to the left arm  After timeout exsanguinated the limb with a 6 inch Esmark.  I made the incision over the in line with the radial border of the ring finger I divided subcutaneous tissue down to the palmar fascia.  I used blunt dissection to find the distal aspect of the carpal tunnel.  I passed a blunt instrument beneath the carpal tunnel and then sharply divided the transverse carpal ligament.  I inspected the contents of the carpal tunnel and there were no space-occupying lesions.  The color of the nerve was dull and the shape was normal.  I irrigated the wound and closed with 3-0 nylon sutures  This was followed up with injection of Marcaine  approximately 10 cc.  We then placed a sterile dressing.  We let the tourniquet down confirm that the color and capillary refill were normal and extubated the patient and took her to the recovery room in stable condition  Postop plan: Remove dressing in 48 hours Move the fingers immediately May shower with a glove on after 48 hours.  Follow-up in 2 weeks for suture removal  PHYSICIAN ASSISTANT:   ASSISTANTS: none   ANESTHESIA:   general  EBL:  none   BLOOD ADMINISTERED:none  DRAINS: none   LOCAL MEDICATIONS USED:   MARCAINE      SPECIMEN:  No Specimen  DISPOSITION OF SPECIMEN:  N/A  COUNTS:  YES  TOURNIQUET:   Total Tourniquet Time Documented: Upper Arm (Left) - 14 minutes Total: Upper Arm (Left) - 14 minutes   DICTATION: .[place] Dictation  PLAN OF CARE: Discharge to home after PACU  PATIENT DISPOSITION:  PACU - hemodynamically stable.   Delay start of Pharmacological VTE agent (>24hrs) due to surgical blood loss or risk of bleeding: not applicable

## 2024-08-17 NOTE — Anesthesia Preprocedure Evaluation (Signed)
 Anesthesia Evaluation  Patient identified by MRN, date of birth, ID band Patient awake    Reviewed: Allergy & Precautions, H&P , NPO status , Patient's Chart, lab work & pertinent test results, reviewed documented beta blocker date and time   Airway Mallampati: II  TM Distance: >3 FB Neck ROM: full    Dental no notable dental hx.    Pulmonary COPD   Pulmonary exam normal breath sounds clear to auscultation       Cardiovascular Exercise Tolerance: Good hypertension, + CAD   Rhythm:regular Rate:Normal     Neuro/Psych  PSYCHIATRIC DISORDERS  Depression    negative neurological ROS     GI/Hepatic negative GI ROS, Neg liver ROS,,,  Endo/Other  Hypothyroidism    Renal/GU negative Renal ROS  negative genitourinary   Musculoskeletal   Abdominal   Peds  Hematology negative hematology ROS (+)   Anesthesia Other Findings   Reproductive/Obstetrics negative OB ROS                              Anesthesia Physical Anesthesia Plan  ASA: 2  Anesthesia Plan: General and General LMA   Post-op Pain Management:    Induction:   PONV Risk Score and Plan: Ondansetron   Airway Management Planned:   Additional Equipment:   Intra-op Plan:   Post-operative Plan:   Informed Consent: I have reviewed the patients History and Physical, chart, labs and discussed the procedure including the risks, benefits and alternatives for the proposed anesthesia with the patient or authorized representative who has indicated his/her understanding and acceptance.     Dental Advisory Given  Plan Discussed with: CRNA  Anesthesia Plan Comments:         Anesthesia Quick Evaluation

## 2024-08-17 NOTE — Interval H&P Note (Signed)
 History and Physical Interval Note:  08/17/2024 8:32 AM  Sharon Pacheco  has presented today for surgery, with the diagnosis of left carpal tunnel release.  The various methods of treatment have been discussed with the patient and family. After consideration of risks, benefits and other options for treatment, the patient has consented to  Procedure(s): CARPAL TUNNEL RELEASE (Left) as a surgical intervention.  The patient's history has been reviewed, patient examined, no change in status, stable for surgery.  I have reviewed the patient's chart and labs.  Questions were answered to the patient's satisfaction.     Taft Minerva

## 2024-08-17 NOTE — Op Note (Signed)
 08/17/2024  9:30 AM  PATIENT:  Sharon Pacheco  68 y.o. female  PRE-OPERATIVE DIAGNOSIS:  left carpal tunnel release  POST-OPERATIVE DIAGNOSIS:  left carpal tunnel release  PROCEDURE:  Procedure(s): CARPAL TUNNEL RELEASE (Left)  SURGEON:  Surgeons and Role:    DEWAINE Margrette Taft FORBES, MD - Primary  Findings Color dull Shape normal Carpal tunnel contents normal  Details of the procedure  I saw the patient in the preop area I confirmed the surgical site is left wrist and marked my initials over the surgical site.  I performed a thorough chart review.  The patient was taken the operating for general anesthesia in supine position  Her left upper extremities prepped and draped sterilely with a tourniquet applied to the left arm  After timeout exsanguinated the limb with a 6 inch Esmark.  I made the incision over the in line with the radial border of the ring finger I divided subcutaneous tissue down to the palmar fascia.  I used blunt dissection to find the distal aspect of the carpal tunnel.  I passed a blunt instrument beneath the carpal tunnel and then sharply divided the transverse carpal ligament.  I inspected the contents of the carpal tunnel and there were no space-occupying lesions.  The color of the nerve was dull and the shape was normal.  I irrigated the wound and closed with 3-0 nylon sutures  This was followed up with injection of Marcaine  approximately 10 cc.  We then placed a sterile dressing.  We let the tourniquet down confirm that the color and capillary refill were normal and extubated the patient and took her to the recovery room in stable condition  Postop plan: Remove dressing in 48 hours Move the fingers immediately May shower with a glove on after 48 hours.  Follow-up in 2 weeks for suture removal  PHYSICIAN ASSISTANT:   ASSISTANTS: none   ANESTHESIA:   general  EBL:  none   BLOOD ADMINISTERED:none  DRAINS: none   LOCAL MEDICATIONS USED:   MARCAINE      SPECIMEN:  No Specimen  DISPOSITION OF SPECIMEN:  N/A  COUNTS:  YES  TOURNIQUET:   Total Tourniquet Time Documented: Upper Arm (Left) - 14 minutes Total: Upper Arm (Left) - 14 minutes   DICTATION: .Nechama Dictation  PLAN OF CARE: Discharge to home after PACU  PATIENT DISPOSITION:  PACU - hemodynamically stable.   Delay start of Pharmacological VTE agent (>24hrs) due to surgical blood loss or risk of bleeding: not applicable

## 2024-08-17 NOTE — Transfer of Care (Signed)
 Immediate Anesthesia Transfer of Care Note  Patient: Sharon Pacheco  Procedure(s) Performed: CARPAL TUNNEL RELEASE (Left: Hand)  Patient Location: PACU  Anesthesia Type:General  Level of Consciousness: drowsy and patient cooperative  Airway & Oxygen Therapy: Patient Spontanous Breathing and Patient connected to face mask oxygen  Post-op Assessment: Report given to RN and Post -op Vital signs reviewed and stable  Post vital signs: Reviewed and stable  Last Vitals:  Vitals Value Taken Time  BP 151/82 08/17/24 09:31  Temp 97.58F oral 08/17/24 09:31  Pulse 78 08/17/24 09:32  Resp 14 08/17/24 09:32  SpO2 97 % 08/17/24 09:32  Vitals shown include unfiled device data.  Last Pain:  Vitals:   08/17/24 0743  TempSrc: Oral  PainSc: 0-No pain         Complications: No notable events documented.

## 2024-08-18 ENCOUNTER — Encounter (HOSPITAL_COMMUNITY): Payer: Self-pay | Admitting: Orthopedic Surgery

## 2024-08-19 NOTE — Anesthesia Postprocedure Evaluation (Signed)
 Anesthesia Post Note  Patient: Sharon Pacheco  Procedure(s) Performed: CARPAL TUNNEL RELEASE (Left: Hand)  Patient location during evaluation: Phase II Anesthesia Type: General Level of consciousness: awake Pain management: pain level controlled Vital Signs Assessment: post-procedure vital signs reviewed and stable Respiratory status: spontaneous breathing and respiratory function stable Cardiovascular status: blood pressure returned to baseline and stable Postop Assessment: no headache and no apparent nausea or vomiting Anesthetic complications: no Comments: Late entry   No notable events documented.   Last Vitals:  Vitals:   08/17/24 1000 08/17/24 1006  BP:  124/66  Pulse: 75 78  Resp: 17 13  Temp:  36.4 C  SpO2: 96% 96%    Last Pain:  Vitals:   08/18/24 1319  TempSrc:   PainSc: 3                  Yvonna JINNY Bosworth

## 2024-08-27 DIAGNOSIS — E039 Hypothyroidism, unspecified: Secondary | ICD-10-CM | POA: Diagnosis not present

## 2024-08-27 DIAGNOSIS — R739 Hyperglycemia, unspecified: Secondary | ICD-10-CM | POA: Diagnosis not present

## 2024-08-27 DIAGNOSIS — E7849 Other hyperlipidemia: Secondary | ICD-10-CM | POA: Diagnosis not present

## 2024-08-27 DIAGNOSIS — Z130 Encounter for screening for diseases of the blood and blood-forming organs and certain disorders involving the immune mechanism: Secondary | ICD-10-CM | POA: Diagnosis not present

## 2024-08-31 DIAGNOSIS — G5602 Carpal tunnel syndrome, left upper limb: Secondary | ICD-10-CM | POA: Insufficient documentation

## 2024-09-01 ENCOUNTER — Ambulatory Visit: Admitting: Orthopedic Surgery

## 2024-09-01 ENCOUNTER — Encounter: Payer: Self-pay | Admitting: Orthopedic Surgery

## 2024-09-01 DIAGNOSIS — G5602 Carpal tunnel syndrome, left upper limb: Secondary | ICD-10-CM

## 2024-09-01 DIAGNOSIS — G5601 Carpal tunnel syndrome, right upper limb: Secondary | ICD-10-CM

## 2024-09-01 DIAGNOSIS — Z01818 Encounter for other preprocedural examination: Secondary | ICD-10-CM

## 2024-09-01 NOTE — Addendum Note (Signed)
 Addended byBETHA JENEAN GREIG LELON on: 09/01/2024 11:10 AM   Modules accepted: Orders

## 2024-09-01 NOTE — Patient Instructions (Signed)
 Your surgery will be at [facility] by Dr Margrette  The hospital will contact you with a preoperative appointment to discuss Anesthesia.  Please arrive on time or 15 minutes early for the preoperative appointment, they have a very tight schedule if you are late or do not come in your surgery will be cancelled.  The phone number is [phone]. Please bring your medications with you for the appointment. They will tell you the arrival time and medication instructions when you have your preoperative evaluation. Do not wear nail polish the day of your surgery and if you take Phentermine you need to stop this medication ONE WEEK prior to your surgery. If you take Invokana, Farxiga, Jardiance, or Steglatro) - Hold 72 hours before the procedure.  If you take Ozempic,  Mounjaro, Bydureon or Trulicity do not take for 8 days before your surgery. If you take Victoza, Rybelsis, Saxenda or Adlyxi stop 24 hours before the procedure.  Please arrive at the hospital 2 hours before procedure if scheduled at 9:30 or later in the day or at the time the nurse tells you at your preoperative visit.   If you have my chart do not use the time given in my chart use the time given to you by the nurse during your preoperative visit.   Your surgery  time may change. Please be available for phone calls the day of your surgery and the day before. The Short Stay department may need to discuss changes about your surgery time. Not reaching the you could lead to procedure delays and possible cancellation.  You must have a ride home and someone to stay with you for 24 to 48 hours. The person taking you home will receive and sign for the your discharge instructions.  Please be prepared to give your support person's name and telephone number to Central Registration. Dr Margrette will need that name and phone number post procedure.

## 2024-09-01 NOTE — Progress Notes (Signed)
     09/01/2024   Chief Complaint  Patient presents with   Post-op Follow-up    Left CTR improved sutures removed without difficulty     Encounter Diagnosis  Name Primary?   Carpal tunnel syndrome, left upper limb s/p release 08/17/24 Yes    What pharmacy do you use ? [facility] ________________________  DOI/DOS/ Date: 08/17/24  Improved  POV 1, POD 15   Sharon Pacheco is doing well after her carpal tunnel release.  She had complete relief of her symptoms  She is ready to schedule the right side  Right carpal tunnel release at her convenience

## 2024-09-01 NOTE — Progress Notes (Signed)
    09/01/2024   Chief Complaint  Patient presents with   Post-op Follow-up    Left CTR improved sutures removed without difficulty     Encounter Diagnosis  Name Primary?   Carpal tunnel syndrome, left upper limb s/p release 08/17/24 Yes    What pharmacy do you use ? [facility]_______  DOI/DOS/ Date: 08/17/24  Improved

## 2024-09-03 ENCOUNTER — Encounter (HOSPITAL_COMMUNITY): Payer: Self-pay

## 2024-09-03 ENCOUNTER — Encounter (HOSPITAL_COMMUNITY)
Admission: RE | Admit: 2024-09-03 | Discharge: 2024-09-03 | Disposition: A | Discharge status: Home / Self Care | Source: Ambulatory Visit | Attending: Orthopedic Surgery | Admitting: Orthopedic Surgery

## 2024-09-03 NOTE — Patient Instructions (Signed)
 Sharon Pacheco  09/03/2024     @PREFPERIOPPHARMACY @   Your procedure is scheduled on 09/07/2024.   Report to [facility] at 6:00 A.M.   Call this number if you have problems the morning of surgery:  [phone]  If you experience any cold or flu symptoms such as cough, fever, chills, shortness of breath, etc. between now and your scheduled surgery, please notify us  at the above number.   Remember:    Do not eat or drink after midnight.        Take these medicines the morning of surgery with A SIP OF WATER : Bupropion Duloxotine Hydrocodone  (if needed) and Levothyroxine    Do not wear jewelry, make-up or nail polish, including gel polish,  artificial nails, or any other type of covering on natural nails (fingers and  toes).  Do not wear lotions, powders, or perfumes, or deodorant.  Do not shave 48 hours prior to surgery.  Men may shave face and neck.  Do not bring valuables to the hospital.  [facility] is not responsible for any belongings or valuables.  Contacts, dentures or bridgework may not be worn into surgery.  Leave your suitcase in the car.  After surgery it may be brought to your room.  For patients admitted to the hospital, discharge time will be determined by your treatment team.  Patients discharged the day of surgery will not be allowed to drive home.   Name and phone number of your driver:   Family  Special instructions:  N/A  Please read over the following fact sheets that you were given.  Care and Recovery After Surgery  Open Carpal Tunnel Release: What to Expect Open carpal tunnel release is a surgery to relieve symptoms caused by carpal tunnel syndrome (CTS). CTS causes swelling in a narrow space in your wrist. The swelling pinches the median nerve, causing pain, numbness, and weakness in your hand. You may need this surgery if other treatments haven't helped your symptoms. The surgery involves cutting a ligament in your wrist to relieve  pressure on the median nerve. Tell a health care provider about: Any allergies you have. All medicines you take. These include vitamins, herbs, eye drops, and creams. Any problems you or family members have had with anesthesia. Any bleeding problems you have. Any surgeries you've had. Any medical problems you have. Whether you're pregnant or may be pregnant. What are the risks? Your health care provider will talk with you about risks. These may include: Infection. Bleeding. Allergic reactions to medicines. Damage to the nerve, a blood vessel, or other nearby structures. Failed treatment. The surgery fails to treat your symptoms or make your symptoms worse. Long-term weakness of the hand. What happens before? When to stop eating and drinking Eat and drink only as you've been told. You may be told this: 8 hours before your surgery Stop eating most foods. Do not eat meat, fried foods, or fatty foods. Eat only light foods, such as toast or crackers. All liquids are OK except energy drinks and alcohol . 6 hours before your surgery Stop eating. Drink only clear liquids, such as water, clear fruit juice, black coffee, plain tea, and sports drinks. Do not drink energy drinks or alcohol . 2 hours before your surgery Stop drinking all liquids. You may be allowed to take medicines with small sips of water. If you do not eat and drink as told, your surgery may be delayed or canceled. Medicines Ask about changing or stopping: Any medicines  you take. Any vitamins, herbs, or supplements you take. Do not take aspirin or ibuprofen unless you're told to. Surgery safety For your safety, you may: Need to wash your skin with a soap that kills germs. Get antibiotics. Have your surgery site marked. Have hair removed at the surgery site. General instructions Ask if you'll be staying overnight in the hospital. If you'll be going home right after the surgery, plan to have a responsible adult: Drive  you home from the hospital or clinic. You won't be allowed to drive. Stay with you for the time you're told. Do not smoke, vape, or use nicotine or tobacco for at least 4 weeks before the surgery. What happens during open carpal tunnel release?  An IV will be put into a vein in your hand or arm. You may be given: A sedative to help you relax. Anesthesia to keep you from feeling pain. A cut will be made in your wrist, on the same side as your palm. The skin of your wrist will be spread to expose the transverse carpal ligament. The ligament will be cut to make more room in the carpal tunnel space. Your cut will be closed with stitches or staples. A compression bandage will be wrapped around your hand and wrist. These steps may vary. Ask what you can expect. What happens after? You'll be watched closely until you leave. This includes checking your pain level, blood pressure, heart rate, and breathing rate. You'll be given pain medicine as needed. A splint or brace may be placed over your bandage. This will hold your hand and wrist in place while you heal. This information is not intended to replace advice given to you by your health care provider. Make sure you discuss any questions you have with your health care provider. Document Revised: 08/11/2023 Document Reviewed: 08/11/2023 Elsevier Patient Education  2024 [employer].   General Anesthesia, Adult, Care After The following information offers guidance on how to care for yourself after your procedure. Your health care provider may also give you more specific instructions. If you have problems or questions, contact your health care provider. What can I expect after the procedure? After the procedure, it is common for people to: Have pain or discomfort at the IV site. Have nausea or vomiting. Have a sore throat or hoarseness. Have trouble concentrating. Feel cold or chills. Feel weak, sleepy, or tired (fatigue). Have soreness and  body aches. These can affect parts of the body that were not involved in surgery. Follow these instructions at home: For the time period you were told by your health care provider:  Rest. Do not participate in activities where you could fall or become injured. Do not drive or use machinery. Do not drink alcohol . Do not take sleeping pills or medicines that cause drowsiness. Do not make important decisions or sign legal documents. Do not take care of children on your own. General instructions Drink enough fluid to keep your urine pale yellow. If you have sleep apnea, surgery and certain medicines can increase your risk for breathing problems. Follow instructions from your health care provider about wearing your sleep device: Anytime you are sleeping, including during daytime naps. While taking prescription pain medicines, sleeping medicines, or medicines that make you drowsy. Return to your normal activities as told by your health care provider. Ask your health care provider what activities are safe for you. Take over-the-counter and prescription medicines only as told by your health care provider. Do not use any products  that contain nicotine or tobacco. These products include cigarettes, chewing tobacco, and vaping devices, such as e-cigarettes. These can delay incision healing after surgery. If you need help quitting, ask your health care provider. Contact a health care provider if: You have nausea or vomiting that does not get better with medicine. You vomit every time you eat or drink. You have pain that does not get better with medicine. You cannot urinate or have bloody urine. You develop a skin rash. You have a fever. Get help right away if: You have trouble breathing. You have chest pain. You vomit blood. These symptoms may be an emergency. Get help right away. Call 911. Do not wait to see if the symptoms will go away. Do not drive yourself to the hospital. Summary After the  procedure, it is common to have a sore throat, hoarseness, nausea, vomiting, or to feel weak, sleepy, or fatigue. For the time period you were told by your health care provider, do not drive or use machinery. Get help right away if you have difficulty breathing, have chest pain, or vomit blood. These symptoms may be an emergency. This information is not intended to replace advice given to you by your health care provider. Make sure you discuss any questions you have with your health care provider. Document Revised: 02/08/2022 Document Reviewed: 02/08/2022 Elsevier Patient Education  2024 [employer].

## 2024-09-06 NOTE — H&P (Signed)
 Sharon Pacheco is an 68 y.o. female.   Chief Complaint: Pain and paresthesias right hand  HPI: This is a 68 year old female with pain and paresthesias of upper extremity.  She had a positive nerve conduction study with EMGs and it show she has severe bilateral carpal tunnel syndrome   She has had successful left carpal tunnel release and now presents for right carpal tunnel release  Review of systems is negative       Past Medical History:  Diagnosis Date   Depression     Hypertension                 Past Surgical History:  Procedure Laterality Date   FOOT SURGERY       KNEE SURGERY Bilateral      4 surgeries right knee 2 surgeries left knee          No family history on file.     Social History:  reports that she has never smoked. She has never used smokeless tobacco. She reports that she does not drink alcohol  and does not use drugs.   Allergies:  Allergies  No Known Allergies     No medications prior to admission.          Lab Results Last 48 Hours  No results found for this or any previous visit (from the past 48 hours).   Imaging Results (Last 48 hours)  No results found.     Review of Systems   No symptoms related to her carpal tunnel syndrome she has some other joint issues previous knee surgery including ACL reconstruction   There were no vitals taken for this visit. Physical Exam    General appearance patient is well-groomed well-nourished she is overweight   She is oriented to person place and time   She has a normal mood and affect   Her gait does not require support   Inspection of her right upper extremity  Skin looks normal.  No swelling or tenderness.  Range of motion normal.  Wrist stability normal.    Strength seems normal.  Pain and paresthesias are involving the median nerve with decrease sensation thumb index and long finger no lymphadenopathy abnormal sensation as stated  Assessment and plan :   EMG & NCV  Findings: Evaluation of the left median motor and the right median motor nerves showed prolonged distal onset latency (L9.1, R5.8 ms), reduced amplitude (L2.4, R4.0 mV), and decreased conduction velocity (Elbow-Wrist, L40, R42 m/s).  The left median (across palm) sensory nerve showed no response (Wrist) and no response (Palm).  The right median (across palm) sensory nerve showed no response (Palm), prolonged distal peak latency (5.2 ms), and reduced amplitude (8.8 V).  All remaining nerves (as indicated in the following tables) were within normal limits.  Left vs. Right side comparison data for the median motor nerve indicates abnormal L-R latency difference (3.3 ms).  All remaining left vs. right side differences were within normal limits.     All examined muscles (as indicated in the following table) showed no evidence of electrical instability.     Impression: The above electrodiagnostic study is ABNORMAL and reveals evidence of a severe bilateral median nerve entrapment at the wrist (carpal tunnel syndrome) affecting sensory and motor components.    There is no significant electrodiagnostic evidence of any other focal nerve entrapment, brachial plexopathy or cervical radiculopathy.    Recommendations: 1.  Follow-up with referring physician. 2.  Continue current management of symptoms.  3.  Suggest surgical evaluation.   ___________________________ Prentice Sharon Pacheco Board Certified, American Board of Physical Medicine and Rehabilitation       Right carpal tunnel syndrome   Right carpal tunnel open release   Taft Minerva, MD 09/06/2024

## 2024-09-07 ENCOUNTER — Ambulatory Visit (HOSPITAL_COMMUNITY)
Admission: RE | Admit: 2024-09-07 | Discharge: 2024-09-07 | Disposition: A | Discharge status: Home / Self Care | Attending: Orthopedic Surgery | Admitting: Orthopedic Surgery

## 2024-09-07 ENCOUNTER — Encounter (HOSPITAL_COMMUNITY): Payer: Self-pay | Admitting: Orthopedic Surgery

## 2024-09-07 ENCOUNTER — Encounter (HOSPITAL_COMMUNITY)
Admission: RE | Disposition: A | Discharge status: Home / Self Care | Payer: Self-pay | Source: Home / Self Care | Attending: Orthopedic Surgery

## 2024-09-07 ENCOUNTER — Ambulatory Visit (HOSPITAL_COMMUNITY): Admitting: Anesthesiology

## 2024-09-07 DIAGNOSIS — J449 Chronic obstructive pulmonary disease, unspecified: Secondary | ICD-10-CM | POA: Insufficient documentation

## 2024-09-07 DIAGNOSIS — G5601 Carpal tunnel syndrome, right upper limb: Secondary | ICD-10-CM

## 2024-09-07 DIAGNOSIS — I2510 Atherosclerotic heart disease of native coronary artery without angina pectoris: Secondary | ICD-10-CM | POA: Diagnosis not present

## 2024-09-07 DIAGNOSIS — E039 Hypothyroidism, unspecified: Secondary | ICD-10-CM | POA: Insufficient documentation

## 2024-09-07 DIAGNOSIS — I10 Essential (primary) hypertension: Secondary | ICD-10-CM | POA: Diagnosis not present

## 2024-09-07 HISTORY — PX: CARPAL TUNNEL RELEASE: SHX101

## 2024-09-07 SURGERY — CARPAL TUNNEL RELEASE
Anesthesia: General | Site: Hand | Laterality: Right

## 2024-09-07 MED ORDER — BUPIVACAINE HCL (PF) 0.5 % IJ SOLN
INTRAMUSCULAR | Status: AC
  Filled 2024-09-07: qty 30

## 2024-09-07 MED ORDER — LIDOCAINE HCL (PF) 1 % IJ SOLN
INTRAMUSCULAR | Status: AC
  Filled 2024-09-07: qty 30

## 2024-09-07 MED ORDER — LIDOCAINE 2% (20 MG/ML) 5 ML SYRINGE
INTRAMUSCULAR | Status: AC
  Filled 2024-09-07: qty 5

## 2024-09-07 MED ORDER — MIDAZOLAM HCL 2 MG/2ML IJ SOLN
INTRAMUSCULAR | Status: DC | PRN
Start: 2024-09-07 — End: 2024-09-07
  Administered 2024-09-07: 2 mg via INTRAVENOUS

## 2024-09-07 MED ORDER — DEXAMETHASONE SOD PHOSPHATE PF 10 MG/ML IJ SOLN
INTRAMUSCULAR | Status: DC | PRN
  Administered 2024-09-07: 5 mg via INTRAVENOUS

## 2024-09-07 MED ORDER — EPHEDRINE SULFATE-NACL 50-0.9 MG/10ML-% IV SOSY
PREFILLED_SYRINGE | INTRAVENOUS | Status: DC | PRN
  Administered 2024-09-07 (×2): 5 mg via INTRAVENOUS

## 2024-09-07 MED ORDER — ONDANSETRON HCL 4 MG/2ML IJ SOLN
INTRAMUSCULAR | Status: AC
  Filled 2024-09-07: qty 2

## 2024-09-07 MED ORDER — CHLORHEXIDINE GLUCONATE 0.12 % MT SOLN
15.0000 mL | Freq: Once | OROMUCOSAL | Status: AC
  Administered 2024-09-07: 15 mL via OROMUCOSAL

## 2024-09-07 MED ORDER — LIDOCAINE 2% (20 MG/ML) 5 ML SYRINGE
INTRAMUSCULAR | Status: DC | PRN
  Administered 2024-09-07: 80 mg via INTRAVENOUS

## 2024-09-07 MED ORDER — ORAL CARE MOUTH RINSE: 15.0000 mL | Freq: Once | OROMUCOSAL | Status: AC

## 2024-09-07 MED ORDER — BUPIVACAINE HCL (PF) 0.5 % IJ SOLN
INTRAMUSCULAR | Status: DC | PRN
  Administered 2024-09-07: 10 mL

## 2024-09-07 MED ORDER — OXYCODONE HCL 5 MG PO TABS
5.0000 mg | ORAL_TABLET | Freq: Once | ORAL | Status: AC | PRN
  Administered 2024-09-07: 5 mg via ORAL
  Filled 2024-09-07: qty 1

## 2024-09-07 MED ORDER — FENTANYL CITRATE (PF) 100 MCG/2ML IJ SOLN
INTRAMUSCULAR | Status: AC
  Filled 2024-09-07: qty 2

## 2024-09-07 MED ORDER — OXYCODONE HCL 5 MG/5ML PO SOLN: 5.0000 mg | Freq: Once | ORAL | Status: AC | PRN

## 2024-09-07 MED ORDER — MIDAZOLAM HCL 2 MG/2ML IJ SOLN
INTRAMUSCULAR | Status: AC
  Filled 2024-09-07: qty 2

## 2024-09-07 MED ORDER — PROPOFOL 10 MG/ML IV BOLUS
INTRAVENOUS | Status: DC | PRN
  Administered 2024-09-07: 120 mg via INTRAVENOUS

## 2024-09-07 MED ORDER — FENTANYL CITRATE (PF) 100 MCG/2ML IJ SOLN
INTRAMUSCULAR | Status: DC | PRN
  Administered 2024-09-07 (×2): 25 ug via INTRAVENOUS

## 2024-09-07 MED ORDER — HYDROCODONE-ACETAMINOPHEN 5-325 MG PO TABS: 1.0000 | ORAL_TABLET | ORAL | 0 refills | Status: DC | PRN

## 2024-09-07 MED ORDER — 0.9 % SODIUM CHLORIDE (POUR BTL) OPTIME
TOPICAL | Status: DC | PRN
  Administered 2024-09-07: 1000 mL

## 2024-09-07 MED ORDER — PROPOFOL 10 MG/ML IV BOLUS
INTRAVENOUS | Status: AC
  Filled 2024-09-07: qty 20

## 2024-09-07 MED ORDER — LACTATED RINGERS IV SOLN: INTRAVENOUS | Status: DC

## 2024-09-07 MED ORDER — ONDANSETRON HCL 4 MG/2ML IJ SOLN: 4.0000 mg | Freq: Once | INTRAMUSCULAR | Status: DC | PRN

## 2024-09-07 MED ORDER — FENTANYL CITRATE (PF) 50 MCG/ML IJ SOSY: 25.0000 ug | PREFILLED_SYRINGE | INTRAMUSCULAR | Status: DC | PRN

## 2024-09-07 MED ORDER — ONDANSETRON HCL 4 MG/2ML IJ SOLN
INTRAMUSCULAR | Status: DC | PRN
  Administered 2024-09-07: 4 mg via INTRAVENOUS

## 2024-09-07 MED ORDER — CEFAZOLIN SODIUM-DEXTROSE 2-4 GM/100ML-% IV SOLN
2.0000 g | INTRAVENOUS | Status: AC
  Administered 2024-09-07: 2 g via INTRAVENOUS
  Filled 2024-09-07: qty 100

## 2024-09-07 SURGICAL SUPPLY — 31 items
BANDAGE ESMARK 4X12 BL STRL LF (DISPOSABLE) ×1 IMPLANT
BLADE SURG 15 STRL LF DISP TIS (BLADE) ×1 IMPLANT
BNDG ELASTIC 2 VLCR STRL LF (GAUZE/BANDAGES/DRESSINGS) ×1 IMPLANT
BNDG ELASTIC 3X5.8 VLCR NS LF (GAUZE/BANDAGES/DRESSINGS) ×1 IMPLANT
BNDG GAUZE DERMACEA FLUFF 4 (GAUZE/BANDAGES/DRESSINGS) IMPLANT
CHLORAPREP W/TINT 26 (MISCELLANEOUS) ×1 IMPLANT
CLOTH BEACON ORANGE TIMEOUT ST (SAFETY) ×1 IMPLANT
COVER LIGHT HANDLE STERIS (MISCELLANEOUS) ×2 IMPLANT
CUFF TOURN SGL QUICK 18X4 (TOURNIQUET CUFF) ×1 IMPLANT
DRAPE HALF SHEET 40X57 (DRAPES) ×1 IMPLANT
ELECTRODE REM PT RTRN 9FT ADLT (ELECTROSURGICAL) ×1 IMPLANT
GAUZE SPONGE 4X4 12PLY STRL (GAUZE/BANDAGES/DRESSINGS) ×1 IMPLANT
GAUZE XEROFORM 1X8 LF (GAUZE/BANDAGES/DRESSINGS) ×1 IMPLANT
GLOVE BIO SURGEON STRL SZ7 (GLOVE) IMPLANT
GLOVE BIOGEL PI IND STRL 7.0 (GLOVE) ×2 IMPLANT
GLOVE BIOGEL PI IND STRL 8.5 (GLOVE) ×1 IMPLANT
GLOVE SKINSENSE STRL SZ8.0 LF (GLOVE) ×1 IMPLANT
GOWN STRL REUS W/TWL LRG LVL3 (GOWN DISPOSABLE) ×1 IMPLANT
GOWN STRL REUS W/TWL XL LVL3 (GOWN DISPOSABLE) ×1 IMPLANT
KIT TURNOVER KIT A (KITS) ×1 IMPLANT
MANIFOLD NEPTUNE II (INSTRUMENTS) ×1 IMPLANT
NDL HYPO 21X1.5 SAFETY (NEEDLE) ×1 IMPLANT
NEEDLE HYPO 21X1.5 SAFETY (NEEDLE) ×1 IMPLANT
NS IRRIG 1000ML POUR BTL (IV SOLUTION) ×1 IMPLANT
PACK BASIC LIMB (CUSTOM PROCEDURE TRAY) ×1 IMPLANT
PAD ARMBOARD POSITIONER FOAM (MISCELLANEOUS) ×1 IMPLANT
POSITIONER HAND ALUMI XLG (MISCELLANEOUS) ×1 IMPLANT
POSITIONER HEAD 8X9X4 ADT (SOFTGOODS) ×1 IMPLANT
SET BASIN LINEN APH (SET/KITS/TRAYS/PACK) ×1 IMPLANT
SUT 3-0 BLK 1X30 PSL (SUTURE) ×1 IMPLANT
SYR CONTROL 10ML LL (SYRINGE) ×1 IMPLANT

## 2024-09-07 NOTE — Anesthesia Preprocedure Evaluation (Signed)
 Anesthesia Evaluation  Patient identified by MRN, date of birth, ID band Patient awake    Reviewed: Allergy & Precautions, H&P , NPO status , Patient's Chart, lab work & pertinent test results, reviewed documented beta blocker date and time   Airway Mallampati: II  TM Distance: >3 FB Neck ROM: full    Dental no notable dental hx.    Pulmonary COPD   Pulmonary exam normal breath sounds clear to auscultation       Cardiovascular Exercise Tolerance: Good hypertension, + CAD   Rhythm:regular Rate:Normal     Neuro/Psych  PSYCHIATRIC DISORDERS  Depression    negative neurological ROS     GI/Hepatic negative GI ROS, Neg liver ROS,,,  Endo/Other  Hypothyroidism    Renal/GU negative Renal ROS  negative genitourinary   Musculoskeletal   Abdominal   Peds  Hematology negative hematology ROS (+)   Anesthesia Other Findings   Reproductive/Obstetrics negative OB ROS                              Anesthesia Physical Anesthesia Plan  ASA: 2  Anesthesia Plan: General and General LMA   Post-op Pain Management:    Induction:   PONV Risk Score and Plan: Ondansetron   Airway Management Planned:   Additional Equipment:   Intra-op Plan:   Post-operative Plan:   Informed Consent: I have reviewed the patients History and Physical, chart, labs and discussed the procedure including the risks, benefits and alternatives for the proposed anesthesia with the patient or authorized representative who has indicated his/her understanding and acceptance.     Dental Advisory Given  Plan Discussed with: CRNA  Anesthesia Plan Comments:         Anesthesia Quick Evaluation

## 2024-09-07 NOTE — Transfer of Care (Signed)
 Immediate Anesthesia Transfer of Care Note  Patient: Sharon Pacheco  Procedure(s) Performed: CARPAL TUNNEL RELEASE (Right: Hand)  Patient Location: PACU  Anesthesia Type:General  Level of Consciousness: awake and alert   Airway & Oxygen Therapy: Patient Spontanous Breathing and Patient connected to nasal cannula oxygen  Post-op Assessment: Report given to RN and Post -op Vital signs reviewed and stable  Post vital signs: Reviewed and stable  Last Vitals:  Vitals Value Taken Time  BP 122/60 09/07/24 08:20  Temp    Pulse 76 09/07/24 08:22  Resp 11 09/07/24 08:22  SpO2 96 % 09/07/24 08:22  Vitals shown include unfiled device data.  Last Pain:  Vitals:   09/07/24 0635  TempSrc: Oral  PainSc: 0-No pain      Patients Stated Pain Goal: 7 (09/07/24 9364)  Complications: No notable events documented.

## 2024-09-07 NOTE — Brief Op Note (Signed)
 09/07/2024  8:11 AM  PATIENT:  Arlanda Renee Ekdahl  68 y.o. female  PRE-OPERATIVE DIAGNOSIS:  carpal tunnel syndrome right  POST-OPERATIVE DIAGNOSIS:  carpal tunnel syndrome right  PROCEDURE:  Procedure(s): CARPAL TUNNEL RELEASE (Right)  SURGEON:  Surgeons and Role:    * Margrette Taft BRAVO, MD - Primary  PHYSICIAN ASSISTANT:   ASSISTANTS: none   ANESTHESIA:   general  EBL:  3cc   BLOOD ADMINISTERED:none  DRAINS: none   LOCAL MEDICATIONS USED:  MARCAINE      SPECIMEN:  No Specimen  DISPOSITION OF SPECIMEN:  N/A  COUNTS:  YES  TOURNIQUET:   Total Tourniquet Time Documented: Upper Arm (Right) - 20 minutes Total: Upper Arm (Right) - 20 minutes   DICTATION: .Dragon Dictation  PLAN OF CARE: Discharge to home after PACU  PATIENT DISPOSITION:  PACU - hemodynamically stable.   Delay start of Pharmacological VTE agent (>24hrs) due to surgical blood loss or risk of bleeding: not applicable

## 2024-09-07 NOTE — Interval H&P Note (Signed)
 History and Physical Interval Note:  09/07/2024 7:16 AM  Sharon Pacheco  has presented today for surgery, with the diagnosis of carpal tunnel syndrome right.  The various methods of treatment have been discussed with the patient and family. After consideration of risks, benefits and other options for treatment, the patient has consented to  Procedure(s): CARPAL TUNNEL RELEASE (Right) as a surgical intervention.  The patient's history has been reviewed, patient examined, no change in status, stable for surgery.  I have reviewed the patient's chart and labs.  Questions were answered to the patient's satisfaction.     Taft Minerva

## 2024-09-07 NOTE — Anesthesia Procedure Notes (Signed)
 Procedure Name: LMA Insertion Date/Time: 09/07/2024 7:38 AM  Performed by: Race Latour, CRNAPre-anesthesia Checklist: Patient identified, Emergency Drugs available, Suction available and Patient being monitored Patient Re-evaluated:Patient Re-evaluated prior to induction Oxygen Delivery Method: Circle system utilized Preoxygenation: Pre-oxygenation with 100% oxygen Induction Type: IV induction Ventilation: Mask ventilation without difficulty LMA: LMA inserted LMA Size: 4.0 Tube type: Oral Number of attempts: 1 Airway Equipment and Method: Stylet and Oral airway Placement Confirmation: positive ETCO2 and breath sounds checked- equal and bilateral Tube secured with: Tape Dental Injury: Teeth and Oropharynx as per pre-operative assessment

## 2024-09-07 NOTE — Op Note (Signed)
 09/07/2024  8:11 AM  PATIENT:  Sharon Pacheco  68 y.o. female  PRE-OPERATIVE DIAGNOSIS:  carpal tunnel syndrome right  POST-OPERATIVE DIAGNOSIS:  carpal tunnel syndrome right  PROCEDURE:  Procedure(s): CARPAL TUNNEL RELEASE (Right)  SURGEON:  Surgeons and Role:    * Margrette Taft BRAVO, MD - Primary  PHYSICIAN ASSISTANT:   ASSISTANTS: none   ANESTHESIA:   general  EBL:  3cc   BLOOD ADMINISTERED:none  DRAINS: none   LOCAL MEDICATIONS USED:  MARCAINE      SPECIMEN:  No Specimen  DISPOSITION OF SPECIMEN:  N/A  COUNTS:  YES  TOURNIQUET:   Total Tourniquet Time Documented: Upper Arm (Right) - 20 minutes Total: Upper Arm (Right) - 20 minutes   DICTATION: .Dragon Dictation  PLAN OF CARE: Discharge to home after PACU  PATIENT DISPOSITION:  PACU - hemodynamically stable.   Delay start of Pharmacological VTE agent (>24hrs) due to surgical blood loss or risk of bleeding: not applicable   Details of procedure  The patient was identified in the preop area.  The surgical site was confirmed as the right wrist and she was signed in and cleared for surgery with appropriate surgeons initials marking over the right carpal tunnel  She was taken to the operating room.  She was placed in the supine position.  She had an LMA anesthetic.  Her right arm was prepped and draped sterilely.  We did the timeout.  We confirmed the right upper extremity for carpal tunnel release  The limb was exsanguinated with the Fornage Esmarch the tourniquet was elevated to 250 mmHg.  We made an incision on the radial side of the ring finger and placed it over the carpal tunnel we divided subcutaneous tissue.  With blunt dissection we found the distal end of the transverse carpal ligament and then sharply released it.  There was a large varicose vein which bled and was treated with electrocautery.  Hemostasis was obtained.  We continued the surgery with irrigation and closure of the wound with 3-0  nylon sutures in interrupted fashion  We injected 10 cc of plain Marcaine  on the radial side of the wound and placed a sterile bandage  We released the tourniquet the hand had good color and capillary refill and the patient was extubated and taken to recovery room in stable condition  Postop plan Sutures out in 15 days Dressing change in 48 hours Keep clean and dry Active range of motion immediately

## 2024-09-09 NOTE — Anesthesia Postprocedure Evaluation (Signed)
 Anesthesia Post Note  Patient: Sharon Pacheco  Procedure(s) Performed: CARPAL TUNNEL RELEASE (Right: Hand)  Patient location during evaluation: Phase II Anesthesia Type: General Level of consciousness: awake Pain management: pain level controlled Vital Signs Assessment: post-procedure vital signs reviewed and stable Respiratory status: spontaneous breathing and respiratory function stable Cardiovascular status: blood pressure returned to baseline and stable Postop Assessment: no headache and no apparent nausea or vomiting Anesthetic complications: no Comments: Late entry   No notable events documented.   Last Vitals:  Vitals:   09/07/24 0845 09/07/24 0907  BP: 99/83 (!) 142/75  Pulse: 72 67  Resp: 11 16  Temp:  36.4 C  SpO2: 94% 97%    Last Pain:  Vitals:   09/08/24 1418  TempSrc:   PainSc: 4                  Yvonna JINNY Bosworth

## 2024-09-10 DIAGNOSIS — E7849 Other hyperlipidemia: Secondary | ICD-10-CM | POA: Diagnosis not present

## 2024-09-10 DIAGNOSIS — M25512 Pain in left shoulder: Secondary | ICD-10-CM | POA: Diagnosis not present

## 2024-09-10 DIAGNOSIS — E119 Type 2 diabetes mellitus without complications: Secondary | ICD-10-CM | POA: Diagnosis not present

## 2024-09-10 DIAGNOSIS — F5101 Primary insomnia: Secondary | ICD-10-CM | POA: Diagnosis not present

## 2024-09-10 DIAGNOSIS — E039 Hypothyroidism, unspecified: Secondary | ICD-10-CM | POA: Diagnosis not present

## 2024-09-10 DIAGNOSIS — I10 Essential (primary) hypertension: Secondary | ICD-10-CM | POA: Diagnosis not present

## 2024-09-10 DIAGNOSIS — E782 Mixed hyperlipidemia: Secondary | ICD-10-CM | POA: Diagnosis not present

## 2024-09-20 DIAGNOSIS — G5601 Carpal tunnel syndrome, right upper limb: Secondary | ICD-10-CM | POA: Insufficient documentation

## 2024-09-22 ENCOUNTER — Ambulatory Visit (INDEPENDENT_AMBULATORY_CARE_PROVIDER_SITE_OTHER): Admitting: Orthopedic Surgery

## 2024-09-22 DIAGNOSIS — G5601 Carpal tunnel syndrome, right upper limb: Secondary | ICD-10-CM

## 2024-09-22 NOTE — Progress Notes (Signed)
   POST OP VISIT   Patient: Sharon Pacheco           Date of Birth: [birth year]           MRN: [id number] Visit Date: 09/22/2024 Requested by: Dow Longs, PA-C [address] [place],  [place] [address] PCP: Dow Longs, PA-C   Encounter Diagnosis  Name Primary?   Carpal tunnel syndrome, right upper limb s/p release 09/07/24 Yes   Procedure open right carpal tunnel release  Chief Complaint  Patient presents with   Post-op Follow-up    CTR Right     Postop day 15  Here for suture removal  Good relief of night pain still some residual tingling in the left and right hand  Incisions healed up nicely patient can resume normal activities at her convenience  No Known Allergies    Current Outpatient Medications:    OZEMPIC, 0.25 OR 0.5 MG/DOSE, 2 MG/3ML SOPN, , Disp: , Rfl:    buPROPion (WELLBUTRIN XL) 150 MG 24 hr tablet, Take 150 mg by mouth in the morning and at bedtime., Disp: , Rfl:    DULoxetine (CYMBALTA) 60 MG capsule, Take 60 mg by mouth daily., Disp: , Rfl:    HYDROcodone -acetaminophen  (NORCO/VICODIN) 5-325 MG tablet, Take 1 tablet by mouth every 4 (four) hours as needed for moderate pain (pain score 4-6)., Disp: 20 tablet, Rfl: 0   levothyroxine (SYNTHROID) 25 MCG tablet, Take 25 mcg by mouth daily before breakfast., Disp: , Rfl:    oxybutynin (DITROPAN-XL) 10 MG 24 hr tablet, Take 10 mg by mouth at bedtime., Disp: , Rfl:    rosuvastatin (CRESTOR) 10 MG tablet, Take 10 mg by mouth daily., Disp: , Rfl:    traZODone (DESYREL) 150 MG tablet, Take 50 mg by mouth at bedtime., Disp: , Rfl:    valsartan (DIOVAN) 80 MG tablet, Take 80 mg by mouth daily., Disp: , Rfl:    zolpidem (AMBIEN) 10 MG tablet, Take 10 mg by mouth at bedtime as needed for sleep., Disp: , Rfl:

## 2024-09-22 NOTE — Progress Notes (Signed)
    09/22/2024   Chief Complaint  Patient presents with   Post-op Follow-up    CTR Right     Encounter Diagnosis  Name Primary?   Carpal tunnel syndrome, right upper limb s/p release 09/07/24 Yes    What pharmacy do you use ? ____[facility]_______  DOI/DOS/ Date: 09/07/24  Improved

## 2024-09-27 ENCOUNTER — Encounter: Payer: Self-pay | Admitting: Radiology

## 2024-11-22 ENCOUNTER — Encounter: Payer: Self-pay | Admitting: Orthopedic Surgery

## 2024-11-22 ENCOUNTER — Ambulatory Visit: Admitting: Orthopedic Surgery

## 2024-11-22 VITALS — Ht 63.0 in | Wt 225.0 lb

## 2024-11-22 DIAGNOSIS — M65312 Trigger thumb, left thumb: Secondary | ICD-10-CM

## 2024-11-22 DIAGNOSIS — M65332 Trigger finger, left middle finger: Secondary | ICD-10-CM

## 2024-11-22 DIAGNOSIS — Z01818 Encounter for other preprocedural examination: Secondary | ICD-10-CM

## 2024-11-22 NOTE — Addendum Note (Signed)
 Addended byBETHA JENEAN GREIG LELON on: 11/22/2024 03:12 PM   Modules accepted: Orders

## 2024-11-22 NOTE — Patient Instructions (Signed)
 Your surgery will be at [facility] by Dr Margrette Jan 6th The hospital will contact you with a preoperative appointment to discuss Anesthesia.  Please arrive on time or 15 minutes early for the preoperative appointment, they have a very tight schedule if you are late or do not come in your surgery will be cancelled.  The phone number is [phone]. Please bring your medications with you for the appointment. They will tell you the arrival time and medication instructions when you have your preoperative evaluation. Do not wear nail polish the day of your surgery and if you take Phentermine you need to stop this medication ONE WEEK prior to your surgery. If you take Invokana, Farxiga, Jardiance, or Steglatro) - Hold 72 hours before the procedure.  If you take Ozempic,  Mounjaro, Bydureon or Trulicity do not take for 8 days before your surgery. If you take Victoza, Rybelsis, Saxenda or Adlyxi stop 24 hours before the procedure.  Please arrive at the hospital 2 hours before procedure if scheduled at 9:30 or later in the day or at the time the nurse tells you at your preoperative visit.   If you have my chart do not use the time given in my chart use the time given to you by the nurse during your preoperative visit.   Your surgery  time may change. Please be available for phone calls the day of your surgery and the day before. The Short Stay department may need to discuss changes about your surgery time. Not reaching the you could lead to procedure delays and possible cancellation.  You must have a ride home and someone to stay with you for 24 to 48 hours. The person taking you home will receive and sign for the your discharge instructions.  Please be prepared to give your support persons name and telephone number to Central Registration. Dr Margrette will need that name and phone number post procedure.

## 2024-11-22 NOTE — Progress Notes (Signed)
" ° °  Patient: Sharon Pacheco           Date of Birth: 1956-07-27           MRN: [id number] Visit Date: 11/22/2024 Requested by: Dow Longs, PA-C [address] [place],  [place] [address] PCP: Dow Longs, PA-C  Encounter Diagnoses  Name Primary?   Trigger finger, left middle finger Yes   Trigger thumb, left thumb     Assessment and plan:  68 year old female with trigger finger left middle or long finger and left thumb.  I offered the patient injections she has already tried splinting.  She would like to proceed directly with surgical intervention.  Expected recovery time 4 weeks  Postop 15 days after surgery to remove sutures  Minimal risk of infection  Patient needs to stop Ozempic at the appropriate time    No orders of the defined types were placed in this encounter.    Chief Complaint  Patient presents with   Hand Problem    Trigger fingers left middle and thumb/ declines injections would like to discuss surgery     History:  68 year old female with acute onset of trigger phenomenon left long finger/middle finger and left thumb.  The patient has to manually reduce the fingers into the straight position  She has pain in the palm.  She is an avid fisherman and would like to have her fingers and condition for her to fish in the spring  She does not want to have injections prefers to have surgery.  She did try a period of splinting without success  Review of systems negative for neurovascular compromise to the left upper extremity  Focused exam findings:  Left hand the left thumb is tender over the A1 pulley on flexion extension of the IP joint catching and locking are noted.  Neurovascular exam is intact  Left long or middle finger again A1 pulley area shows tenderness there is catching of the left long finger.  Flexor tendons are normal.  Physical Exam Vitals and nursing note reviewed.  Constitutional:      Appearance: Normal appearance.  HENT:     Head:  Normocephalic and atraumatic.  Eyes:     General: No scleral icterus.       Right eye: No discharge.        Left eye: No discharge.     Extraocular Movements: Extraocular movements intact.     Conjunctiva/sclera: Conjunctivae normal.     Pupils: Pupils are equal, round, and reactive to light.  Cardiovascular:     Rate and Rhythm: Normal rate.     Pulses: Normal pulses.  Skin:    General: Skin is warm and dry.     Capillary Refill: Capillary refill takes less than 2 seconds.  Neurological:     General: No focal deficit present.     Mental Status: She is alert and oriented to person, place, and time.  Psychiatric:        Mood and Affect: Mood normal.        Behavior: Behavior normal.        Thought Content: Thought content normal.        Judgment: Judgment normal.        "

## 2024-11-22 NOTE — Progress Notes (Signed)
 Sharon Pacheco                                          MRN: [id number]   11/22/2024   The [facility] Quality Team Specialist reviewed this patient medical record for the purposes of chart review for care gap closure. The following were reviewed: chart review for care gap closure-kidney health evaluation for diabetes:eGFR  and uACR.    [facility] Quality Team

## 2024-11-22 NOTE — Progress Notes (Signed)
" °  Intake history:  Chief Complaint  Patient presents with   Hand Problem    Trigger fingers left middle and thumb/ declines injections would like to discuss surgery      Ht 5' 3 (1.6 m)   Wt 225 lb (102.1 kg)   BMI 39.86 kg/m  Body mass index is 39.86 kg/m.  Pharmacy? _____________[facility]_______  WHAT ARE WE SEEING YOU FOR TODAY?   Hand left  How long has this bothered you? (DOI?DOS?WS?)  Several wks  Was there an injury? No  Anticoag.  No   Any ALLERGIES ______________Allergies[1] ________________________________   Treatment:  Have you taken:  Tylenol  No  Advil No  Had PT No  Had injection No  Other  _________________________        [1] No Known Allergies  "

## 2024-11-23 NOTE — Patient Instructions (Signed)
 "         Sharon Pacheco  11/23/2024     @[email or web address] @   Your procedure is scheduled on  11/30/2024.   [facility] to [facility] at  0700 A.M.   Call this number if you have problems the morning of surgery:  [phone]  If you experience any cold or flu symptoms such as cough, fever, chills, shortness of breath, etc. between now and your scheduled surgery, please notify us  at the above number.   Remember:         Your last dose of ozempic should be on 11/22/2024.    Do not eat after midnight.    You may drink clear liquids until 0500 am on 11/30/2024.         Clear liquids allowed are:                    Water, Carbonated beverages (diabetics please choose diet or no sugar options), Black Coffee Only (No creamer, milk or cream, including half & half and powdered creamer), and Clear Sports drink (No red color; diabetics please choose diet or no sugar options)    Take these medicines the morning of surgery with A SIP OF WATER                duloxetine,hydrocodone (if needed), levothyroxine.    Do not wear jewelry, make-up or nail polish, including gel polish,  artificial nails, or any other type of covering on natural nails (fingers and  toes).  Do not wear lotions, powders, or perfumes, or deodorant.  Do not shave 48 hours prior to surgery.  Men may shave face and neck.  Do not bring valuables to the hospital.  [facility] is not responsible for any belongings or valuables.  Contacts, dentures or bridgework may not be worn into surgery.  Leave your suitcase in the car.  After surgery it may be brought to your room.  For patients admitted to the hospital, discharge time will be determined by your treatment team.  Patients discharged the day of surgery will not be allowed to drive home and must have someone with them for 24 hours.    Special instructions:     DO NOT smoke tobacco or vape for 24 hours before your procedure.  Please read over the following  fact sheets that you were given. Coughing and Deep Breathing, Surgical Site Infection Prevention, Anesthesia Post-op Instructions, and Care and Recovery After Surgery        Trigger Finger Release, Care After After trigger finger release, it is common to have: Stiffness. Soreness. Swelling. Follow these instructions at home: Incision care  Keep the compression bandage on for 48 hours or as told by your health care provider. After removing it, follow instructions about how to take care of your incision. Make sure you: Wash your hands with soap and water for at least 20 seconds before and after you change your bandage (dressing). If soap and water are not available, use hand sanitizer. Change your dressing as told by your provider. Leave stitches (sutures), skin glue, or tape strips in place. These skin closures may need to stay in place for 2 weeks or longer. If tape strip edges start to loosen and curl up, you may trim the loose edges. Do not remove tape strips completely unless your provider tells you to do that. Keep your hand and dressing clean and dry. Check your incision area every day for signs of infection. Check for: More  redness, swelling, or pain. Fluid or blood. Warmth. Pus or a bad smell. Bathing Do not take baths, swim, or use a hot tub until your provider approves. Keep your dressing dry until your provider says it can be removed. Cover it with a watertight covering when you take a bath or a shower. Managing pain, stiffness, and swelling  If told, put ice on your palm. Put ice in a plastic bag. Place a towel between your skin and the bag. Leave the ice on for 20 minutes, 2-3 times a day. If your skin turns bright red, remove the ice right away to prevent skin damage. The risk of damage is higher if you cannot feel pain, heat, or cold. Move your fingers often to reduce stiffness and swelling. Raise (elevate) your hand above the level of your heart while you are  sitting or lying down. Activity If you were given a sedative during the procedure, it can affect you for several hours. Do not drive or operate machinery until your provider says that it is safe. You may have to avoid lifting. Ask your provider how much you can safely lift. Avoid any activity that causes pain. It may take 4-6 months for stiffness to go away. Return to your normal activities as told by your provider. Ask your provider what activities are safe for you. If hand therapy was prescribed, do exercises as told. This will help you regain movement. General instructions Take over-the-counter and prescription medicines only as told by your provider. Do not use any products that contain nicotine or tobacco. These products include cigarettes, chewing tobacco, and vaping devices, such as e-cigarettes. If you need help quitting, ask your provider. Keep all follow-up visits. If you have sutures, these will be removed in about 10-14 days. Your health care provider may give you more instructions. Make sure you know what you can and cannot do. Contact a health care provider if: You have chills or fever. You have any signs of infection. You are unable to move your finger because of pain or stiffness. You have any tingling or numbness in your hand or fingers. This information is not intended to replace advice given to you by your health care provider. Make sure you discuss any questions you have with your health care provider. Document Revised: 06/25/2022 Document Reviewed: 06/25/2022 Elsevier Patient Education  2024 [employer].General Anesthesia, Adult, Care After The following information offers guidance on how to care for yourself after your procedure. Your health care provider may also give you more specific instructions. If you have problems or questions, contact your health care provider. What can I expect after the procedure? After the procedure, it is common for people to: Have pain or  discomfort at the IV site. Have nausea or vomiting. Have a sore throat or hoarseness. Have trouble concentrating. Feel cold or chills. Feel weak, sleepy, or tired (fatigue). Have soreness and body aches. These can affect parts of the body that were not involved in surgery. Follow these instructions at home: For the time period you were told by your health care provider:  Rest. Do not participate in activities where you could fall or become injured. Do not drive or use machinery. Do not drink alcohol . Do not take sleeping pills or medicines that cause drowsiness. Do not make important decisions or sign legal documents. Do not take care of children on your own. General instructions Drink enough fluid to keep your urine pale yellow. If you have sleep apnea, surgery and certain medicines  can increase your risk for breathing problems. Follow instructions from your health care provider about wearing your sleep device: Anytime you are sleeping, including during daytime naps. While taking prescription pain medicines, sleeping medicines, or medicines that make you drowsy. Return to your normal activities as told by your health care provider. Ask your health care provider what activities are safe for you. Take over-the-counter and prescription medicines only as told by your health care provider. Do not use any products that contain nicotine or tobacco. These products include cigarettes, chewing tobacco, and vaping devices, such as e-cigarettes. These can delay incision healing after surgery. If you need help quitting, ask your health care provider. Contact a health care provider if: You have nausea or vomiting that does not get better with medicine. You vomit every time you eat or drink. You have pain that does not get better with medicine. You cannot urinate or have bloody urine. You develop a skin rash. You have a fever. Get help right away if: You have trouble breathing. You have chest  pain. You vomit blood. These symptoms may be an emergency. Get help right away. Call 911. Do not wait to see if the symptoms will go away. Do not drive yourself to the hospital. Summary After the procedure, it is common to have a sore throat, hoarseness, nausea, vomiting, or to feel weak, sleepy, or fatigue. For the time period you were told by your health care provider, do not drive or use machinery. Get help right away if you have difficulty breathing, have chest pain, or vomit blood. These symptoms may be an emergency. This information is not intended to replace advice given to you by your health care provider. Make sure you discuss any questions you have with your health care provider. Document Revised: 02/08/2022 Document Reviewed: 02/08/2022 Elsevier Patient Education  2024 Else[employer] Inc.How to Use Chlorhexidine  at Home in the Shower Chlorhexidine  gluconate (CHG) is a germ-killing (antiseptic) wash that's used to clean the skin. It can get rid of the germs that normally live on the skin and can keep them away for about 24 hours. If you're having surgery, you may be told to shower with CHG at home the night before surgery. This can help lower your risk for infection. To use CHG wash in the shower, follow the steps below. Supplies needed: CHG body wash. Clean washcloth. Clean towel. How to use CHG in the shower Follow these steps unless you're told to use CHG in a different way: Start the shower. Use your normal soap and shampoo to wash your face and hair. Turn off the shower or move out of the shower stream. Pour CHG onto a clean washcloth. Do not use any type of brush or rough sponge. Start at your neck, washing your body down to your toes. Make sure you: Wash the part of your body where the surgery will be done for at least 1 minute. Do not scrub. Do not use CHG on your head or face unless your health care provider tells you to. If it gets into your ears or eyes, rinse them well  with water. Do not wash your genitals with CHG. Wash your back and under your arms. Make sure to wash skin folds. Let the CHG sit on your skin for 1-2 minutes or as long as told. Rinse your entire body in the shower, including all body creases and folds. Turn off the shower. Dry off with a clean towel. Do not put anything on your skin afterward,  such as powder, lotion, or perfume. Put on clean clothes or pajamas. If it's the night before surgery, sleep in clean sheets. General tips Use CHG only as told, and follow the instructions on the label. Use the full amount of CHG as told. This is often one bottle. Do not smoke and stay away from flames after using CHG. Your skin may feel sticky after using CHG. This is normal. The sticky feeling will go away as the CHG dries. Do not use CHG: If you have a chlorhexidine  allergy or have reacted to chlorhexidine  in the past. On open wounds or areas of skin that have broken skin, cuts, or scrapes. On babies younger than 53 months of age. Contact a health care provider if: You have questions about using CHG. Your skin gets irritated or itchy. You have a rash after using CHG. You swallow any CHG. Call your local poison control center [phone] in the [place] Your eyes itch badly, or they become very red or swollen. Your hearing changes. You have trouble seeing. If you can't reach your provider, go to an urgent care or emergency room. Do not drive yourself. Get help right away if: You have swelling or tingling in your mouth or throat. You make high-pitched whistling sounds when you breathe, most often when you breathe out (wheeze). You have trouble breathing. These symptoms may be an emergency. Call 911 right away. Do not wait to see if the symptoms will go away. Do not drive yourself to the hospital. This information is not intended to replace advice given to you by your health care provider. Make sure you discuss any questions you have with  your health care provider. Document Revised: 05/27/2023 Document Reviewed: 05/23/2022 Elsevier Patient Education  2024 Else[employer] Inc.How to Use an Incentive Spirometer An incentive spirometer is a tool that measures how well you are filling your lungs with each breath. Learning to take long, deep breaths using this tool can help you keep your lungs clear and active. This may help to reverse or lessen your chance of developing breathing (pulmonary) problems, especially infection. You may be asked to use a spirometer: After a surgery. If you have a lung problem or a history of smoking. After a long period of time when you have been unable to move or be active. If the spirometer includes an indicator to show the highest number that you have reached, your health care provider or respiratory therapist will help you set a goal. Keep a log of your progress as told by your health care provider. What are the risks? Breathing too quickly may cause dizziness or cause you to pass out. Take your time so you do not get dizzy or light-headed. If you are in pain, you may need to take pain medicine before doing incentive spirometry. It is harder to take a deep breath if you are having pain. How to use your incentive spirometer  Sit up on the edge of your bed or on a chair. Hold the incentive spirometer so that it is in an upright position. Before you use the spirometer, breathe out normally. Place the mouthpiece in your mouth. Make sure your lips are closed tightly around it. Breathe in slowly and as deeply as you can through your mouth, causing the piston or the ball to rise toward the top of the chamber. Hold your breath for 3-5 seconds, or for as long as possible. If the spirometer includes a coach indicator, use this to guide you in breathing. Slow  down your breathing if the indicator goes above the marked areas. Remove the mouthpiece from your mouth and breathe out normally. The piston or ball will return to  the bottom of the chamber. Rest for a few seconds, then repeat the steps 10 or more times. Take your time and take a few normal breaths between deep breaths so that you do not get dizzy or light-headed. Do this every 1-2 hours when you are awake. If the spirometer includes a goal marker to show the highest number you have reached (best effort), use this as a goal to work toward during each repetition. After each set of 10 deep breaths, cough a few times. This will help to make sure that your lungs are clear. If you have an incision on your chest or abdomen from surgery, place a pillow or a rolled-up towel firmly against the incision when you cough. This can help to reduce pain while taking deep breaths and coughing. General tips When you are able to get out of bed: Walk around often. Continue to take deep breaths and cough in order to clear your lungs. Keep using the incentive spirometer until your health care provider says it is okay to stop using it. If you have been in the hospital, you may be told to keep using the spirometer at home. Contact a health care provider if: You are having difficulty using the spirometer. You have trouble using the spirometer as often as instructed. Your pain medicine is not giving enough relief for you to use the spirometer as told. You have a fever. Get help right away if: You develop shortness of breath. You develop a cough with bloody mucus from the lungs. You have fluid or blood coming from an incision site after you cough. Summary An incentive spirometer is a tool that can help you learn to take long, deep breaths to keep your lungs clear and active. You may be asked to use a spirometer after a surgery, if you have a lung problem or a history of smoking, or if you have been inactive for a long period of time. Use your incentive spirometer as instructed every 1-2 hours while you are awake. If you have an incision on your chest or abdomen, place a pillow  or a rolled-up towel firmly against your incision when you cough. This will help to reduce pain. Get help right away if you have shortness of breath, you cough up bloody mucus, or blood comes from your incision when you cough. This information is not intended to replace advice given to you by your health care provider. Make sure you discuss any questions you have with your health care provider. Document Revised: 09/19/2023 Document Reviewed: 09/19/2023 Elsevier Patient Education  2024 [employer]. "

## 2024-11-26 ENCOUNTER — Other Ambulatory Visit: Payer: Self-pay

## 2024-11-26 ENCOUNTER — Encounter (HOSPITAL_COMMUNITY)
Admission: RE | Admit: 2024-11-26 | Discharge: 2024-11-26 | Disposition: A | Source: Ambulatory Visit | Attending: Orthopedic Surgery

## 2024-11-26 ENCOUNTER — Encounter (HOSPITAL_COMMUNITY): Payer: Self-pay

## 2024-11-26 DIAGNOSIS — Z01812 Encounter for preprocedural laboratory examination: Secondary | ICD-10-CM | POA: Insufficient documentation

## 2024-11-26 DIAGNOSIS — M65332 Trigger finger, left middle finger: Secondary | ICD-10-CM | POA: Insufficient documentation

## 2024-11-26 DIAGNOSIS — M65312 Trigger thumb, left thumb: Secondary | ICD-10-CM | POA: Diagnosis not present

## 2024-11-26 DIAGNOSIS — Z01818 Encounter for other preprocedural examination: Secondary | ICD-10-CM

## 2024-11-26 HISTORY — DX: Chronic obstructive pulmonary disease, unspecified: J44.9

## 2024-11-26 HISTORY — DX: Unspecified osteoarthritis, unspecified site: M19.90

## 2024-11-26 LAB — BASIC METABOLIC PANEL WITH GFR
Anion gap: 14 (ref 5–15)
BUN: 16 mg/dL (ref 8–23)
CO2: 22 mmol/L (ref 22–32)
Calcium: 8.8 mg/dL — ABNORMAL LOW (ref 8.9–10.3)
Chloride: 105 mmol/L (ref 98–111)
Creatinine, Ser: 0.89 mg/dL (ref 0.44–1.00)
GFR, Estimated: >60 mL/min
Glucose, Bld: 104 mg/dL — ABNORMAL HIGH (ref 70–99)
Potassium: 3.7 mmol/L (ref 3.5–5.1)
Sodium: 141 mmol/L (ref 135–145)

## 2024-11-26 LAB — CBC WITH DIFFERENTIAL/PLATELET
Abs Immature Granulocytes: 0.01 K/uL (ref 0.00–0.07)
Basophils Absolute: 0.1 K/uL (ref 0.0–0.1)
Basophils Relative: 1 %
Eosinophils Absolute: 0.2 K/uL (ref 0.0–0.5)
Eosinophils Relative: 5 %
HCT: 39.1 % (ref 36.0–46.0)
Hemoglobin: 12.8 g/dL (ref 12.0–15.0)
Immature Granulocytes: 0 %
Lymphocytes Relative: 43 %
Lymphs Abs: 2.2 K/uL (ref 0.7–4.0)
MCH: 31.7 pg (ref 26.0–34.0)
MCHC: 32.7 g/dL (ref 30.0–36.0)
MCV: 96.8 fL (ref 80.0–100.0)
Monocytes Absolute: 0.5 K/uL (ref 0.1–1.0)
Monocytes Relative: 9 %
Neutro Abs: 2.1 K/uL (ref 1.7–7.7)
Neutrophils Relative %: 42 %
Platelets: 177 K/uL (ref 150–400)
RBC: 4.04 MIL/uL (ref 3.87–5.11)
RDW: 12.9 % (ref 11.5–15.5)
WBC: 5 K/uL (ref 4.0–10.5)
nRBC: 0 % (ref 0.0–0.2)

## 2024-11-29 NOTE — H&P (Signed)
 Sharon Pacheco is an 69 y.o. female.   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  We discussed treatment options for her trigger fingers but she is fairly adamant about proceeding with surgical intervention versus treatment options presented to her of nonoperative treatment  So we are going to proceed with a left thumb trigger finger release with its associated risks of nerve injury and infection as well as a left middle or long finger trigger release with the same potential complications  Past Medical History:  Diagnosis Date   Arthritis    COPD (chronic obstructive pulmonary disease) (HCC)    Depression    Hypertension     Past Surgical History:  Procedure Laterality Date   CARPAL TUNNEL RELEASE Left 08/17/2024   Procedure: CARPAL TUNNEL RELEASE;  Surgeon: Margrette Taft BRAVO, MD;  Location: [facility];  Service: Orthopedics;  Laterality: Left;   CARPAL TUNNEL RELEASE Right 09/07/2024   Procedure: CARPAL TUNNEL RELEASE;  Surgeon: Margrette Taft BRAVO, MD;  Location: [facility];  Service: Orthopedics;  Laterality: Right;   FOOT SURGERY Bilateral    X5 bunionectomies   KNEE SURGERY Bilateral    4 surgeries right knee 2 surgeries left knee    No family history on file. Social History:  reports that she has never smoked. She has never used smokeless tobacco. She reports that she does not drink alcohol  and does not use drugs.  Allergies: Allergies[1]  No medications prior to admission.    No results found for this or any previous visit (from the past 48 hours). No results found.  Review of Systems  Currently no chest pain shortness of breath bowel or bladder dysfunction  There were no vitals taken for this visit. Physical Exam   General Appearance: Mesomorphic if not endomorphic body habitus  Orientation normal to  person place and time  Mood pleasant and affect normal  Gait and station noncontributory  Left long/middle and thumb tenderness is noted over the A1 pulley range of motion is full with some catching no instability noted in either digit strength muscle tone tendon function normal skin intact no infection normal color and capillary refill and temperature normal sensation  Assessment Plan Left middle/long finger triggering and left thumb finger triggering  Plan surgical release left long finger and left thumb  Taft Margrette, MD 11/29/2024, 4:03 PM       [1] No Known Allergies

## 2024-11-30 ENCOUNTER — Ambulatory Visit (HOSPITAL_COMMUNITY): Admitting: Anesthesiology

## 2024-11-30 ENCOUNTER — Ambulatory Visit (HOSPITAL_COMMUNITY)
Admission: RE | Admit: 2024-11-30 | Discharge: 2024-11-30 | Disposition: A | Discharge status: Home / Self Care | Attending: Orthopedic Surgery | Admitting: Orthopedic Surgery

## 2024-11-30 ENCOUNTER — Encounter (HOSPITAL_COMMUNITY): Payer: Self-pay | Admitting: Orthopedic Surgery

## 2024-11-30 ENCOUNTER — Encounter (HOSPITAL_COMMUNITY)
Admission: RE | Disposition: A | Discharge status: Home / Self Care | Payer: Self-pay | Source: Home / Self Care | Attending: Orthopedic Surgery

## 2024-11-30 DIAGNOSIS — I10 Essential (primary) hypertension: Secondary | ICD-10-CM | POA: Insufficient documentation

## 2024-11-30 DIAGNOSIS — J449 Chronic obstructive pulmonary disease, unspecified: Secondary | ICD-10-CM | POA: Insufficient documentation

## 2024-11-30 DIAGNOSIS — M65332 Trigger finger, left middle finger: Secondary | ICD-10-CM | POA: Diagnosis not present

## 2024-11-30 DIAGNOSIS — I2510 Atherosclerotic heart disease of native coronary artery without angina pectoris: Secondary | ICD-10-CM | POA: Diagnosis not present

## 2024-11-30 DIAGNOSIS — M65312 Trigger thumb, left thumb: Secondary | ICD-10-CM

## 2024-11-30 HISTORY — PX: TRIGGER FINGER RELEASE: SHX641

## 2024-11-30 SURGERY — RELEASE, A1 PULLEY, FOR TRIGGER FINGER
Anesthesia: Monitor Anesthesia Care | Site: Finger | Laterality: Left

## 2024-11-30 MED ORDER — MIDAZOLAM HCL 2 MG/2ML IJ SOLN
INTRAMUSCULAR | Status: AC
  Filled 2024-11-30: qty 2

## 2024-11-30 MED ORDER — FENTANYL CITRATE (PF) 100 MCG/2ML IJ SOLN
INTRAMUSCULAR | Status: AC
  Filled 2024-11-30: qty 2

## 2024-11-30 MED ORDER — ONDANSETRON HCL 4 MG/2ML IJ SOLN
INTRAMUSCULAR | Status: DC | PRN
  Administered 2024-11-30: 4 mg via INTRAVENOUS

## 2024-11-30 MED ORDER — LIDOCAINE 2% (20 MG/ML) 5 ML SYRINGE
INTRAMUSCULAR | Status: AC
  Filled 2024-11-30: qty 5

## 2024-11-30 MED ORDER — FENTANYL CITRATE (PF) 100 MCG/2ML IJ SOLN
INTRAMUSCULAR | Status: DC | PRN
  Administered 2024-11-30 (×2): 50 ug via INTRAVENOUS

## 2024-11-30 MED ORDER — CHLORHEXIDINE GLUCONATE 0.12 % MT SOLN
15.0000 mL | Freq: Once | OROMUCOSAL | Status: AC
  Administered 2024-11-30: 15 mL via OROMUCOSAL

## 2024-11-30 MED ORDER — ORAL CARE MOUTH RINSE: 15.0000 mL | Freq: Once | OROMUCOSAL | Status: AC

## 2024-11-30 MED ORDER — CEFAZOLIN SODIUM-DEXTROSE 2-4 GM/100ML-% IV SOLN
2.0000 g | INTRAVENOUS | Status: AC
  Administered 2024-11-30: 2 g via INTRAVENOUS
  Filled 2024-11-30: qty 100

## 2024-11-30 MED ORDER — ONDANSETRON HCL 4 MG/2ML IJ SOLN
INTRAMUSCULAR | Status: AC
  Filled 2024-11-30: qty 2

## 2024-11-30 MED ORDER — BUPIVACAINE HCL (PF) 0.5 % IJ SOLN
INTRAMUSCULAR | Status: AC
  Filled 2024-11-30: qty 30

## 2024-11-30 MED ORDER — PROPOFOL 500 MG/50ML IV EMUL
INTRAVENOUS | Status: DC | PRN
  Administered 2024-11-30: 200 mg via INTRAVENOUS

## 2024-11-30 MED ORDER — MIDAZOLAM HCL (PF) 2 MG/2ML IJ SOLN
INTRAMUSCULAR | Status: DC | PRN
  Administered 2024-11-30: 2 mg via INTRAVENOUS

## 2024-11-30 MED ORDER — DEXMEDETOMIDINE HCL IN NACL 80 MCG/20ML IV SOLN
INTRAVENOUS | Status: DC | PRN
  Administered 2024-11-30: 6 ug via INTRAVENOUS

## 2024-11-30 MED ORDER — SODIUM CHLORIDE 0.9 % IR SOLN
Status: DC | PRN
  Administered 2024-11-30: 1000 mL

## 2024-11-30 MED ORDER — BUPIVACAINE HCL (PF) 0.5 % IJ SOLN
INTRAMUSCULAR | Status: DC | PRN
  Administered 2024-11-30: 10 mL

## 2024-11-30 MED ORDER — LIDOCAINE 2% (20 MG/ML) 5 ML SYRINGE
INTRAMUSCULAR | Status: DC | PRN
  Administered 2024-11-30: 80 mg via INTRAVENOUS

## 2024-11-30 MED ORDER — LACTATED RINGERS IV SOLN: INTRAVENOUS | Status: DC

## 2024-11-30 MED ORDER — HYDROCODONE-ACETAMINOPHEN 5-325 MG PO TABS: 1.0000 | ORAL_TABLET | ORAL | 0 refills | Status: AC | PRN

## 2024-11-30 MED ORDER — DEXAMETHASONE SOD PHOSPHATE PF 10 MG/ML IJ SOLN
INTRAMUSCULAR | Status: DC | PRN
  Administered 2024-11-30: 6 mg via INTRAVENOUS

## 2024-11-30 SURGICAL SUPPLY — 35 items
BANDAGE ELASTIC 2 LF NS (GAUZE/BANDAGES/DRESSINGS) IMPLANT
BANDAGE ESMARK 4X12 BL STRL LF (DISPOSABLE) ×1 IMPLANT
BLADE SURG 15 STRL LF DISP TIS (BLADE) ×1 IMPLANT
BNDG COHESIVE 3X5 TAN STRL LF (GAUZE/BANDAGES/DRESSINGS) ×1 IMPLANT
BNDG COHESIVE 4X5 TAN STRL LF (GAUZE/BANDAGES/DRESSINGS) ×1 IMPLANT
BNDG CONFORM 6X.82 1P STRL (GAUZE/BANDAGES/DRESSINGS) IMPLANT
BNDG ELASTIC 2X5.8 VLCR NS LF (GAUZE/BANDAGES/DRESSINGS) ×1 IMPLANT
CHLORAPREP W/TINT 26 (MISCELLANEOUS) ×1 IMPLANT
CLOTH BEACON ORANGE TIMEOUT ST (SAFETY) ×1 IMPLANT
COVER LIGHT HANDLE STERIS (MISCELLANEOUS) ×2 IMPLANT
CUFF TOURN SGL QUICK 18X4 (TOURNIQUET CUFF) ×1 IMPLANT
DRAPE HALF SHEET 40X57 (DRAPES) ×1 IMPLANT
ELECT NEEDLE TIP 2.8 STRL (NEEDLE) ×1 IMPLANT
ELECTRODE REM PT RTRN 9FT ADLT (ELECTROSURGICAL) ×1 IMPLANT
GAUZE 4X4 16PLY ~~LOC~~+RFID DBL (SPONGE) ×1 IMPLANT
GAUZE SPONGE 4X4 12PLY STRL (GAUZE/BANDAGES/DRESSINGS) ×1 IMPLANT
GAUZE STRETCH 2X75IN STRL (MISCELLANEOUS) ×1 IMPLANT
GAUZE XEROFORM 1X8 LF (GAUZE/BANDAGES/DRESSINGS) ×1 IMPLANT
GLOVE BIOGEL PI IND STRL 7.0 (GLOVE) ×2 IMPLANT
GLOVE BIOGEL PI IND STRL 8.5 (GLOVE) ×1 IMPLANT
GLOVE SKINSENSE STRL SZ8.0 LF (GLOVE) ×1 IMPLANT
GOWN STRL REUS W/TWL LRG LVL3 (GOWN DISPOSABLE) ×1 IMPLANT
GOWN STRL REUS W/TWL XL LVL3 (GOWN DISPOSABLE) ×1 IMPLANT
KIT TURNOVER KIT A (KITS) ×1 IMPLANT
MANIFOLD NEPTUNE II (INSTRUMENTS) ×1 IMPLANT
NEEDLE HYPO 21X1.5 SAFETY (NEEDLE) ×1 IMPLANT
PACK BASIC LIMB (CUSTOM PROCEDURE TRAY) ×1 IMPLANT
PAD ARMBOARD POSITIONER FOAM (MISCELLANEOUS) ×1 IMPLANT
POSITIONER HAND ALUMI XLG (MISCELLANEOUS) ×1 IMPLANT
POSITIONER HEAD 8X9X4 ADT (SOFTGOODS) ×1 IMPLANT
SET BASIN LINEN APH (SET/KITS/TRAYS/PACK) ×1 IMPLANT
SOLN 0.9% NACL POUR BTL 1000ML (IV SOLUTION) ×1 IMPLANT
SPIKE FLUID TRANSFER (MISCELLANEOUS) ×1 IMPLANT
SUT 3-0 BLK 1X30 PSL (SUTURE) ×1 IMPLANT
SYR CONTROL 10ML LL (SYRINGE) ×1 IMPLANT

## 2024-11-30 NOTE — Op Note (Signed)
 11/30/2024  10:03 AM  PATIENT:  Sharon Pacheco  69 y.o. 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   Delay start of Pharmacological VTE agent (>24hrs) due to surgical blood loss or risk of bleeding: not applicable   The surgery was done as follows  The patient was identified in the preop area and the surgical sites were confirmed as left thumb and left long/middle finger.  After marking these digits a chart review was completed and the patient was taken to the operating room for general anesthesia in the supine position  After sterile prep and drape timeout was taken and completed and the surgical sites were confirmed as stated  The limb was exsanguinated with a 4 inch Esmark the tourniquet was elevated to 250 mmHg.  I started with the left long finger.  I made a transverse incision over the A1 pulley I divided the subcutaneous tissue with blunt dissection I protected the neurovascular bundles and then used a Freer to identify the A1 pulley.  I took a sharp scissor and released it.  I then flexed the finger several times pulled the  tendon out of the wound and ensured that the tendon had free excursion.  After irrigation I closed with 2, 3-0 nylon sutures with mattress configuration  I then addressed the thumb.  At the flexion crease I made a transverse incision I divided subcutaneous tissue bluntly I found the nerve on the ulnar side of the thumb protected it took the freer identified the transverse A1 pulley and with a sharp scissor divided.  I used a blunt object to pull the tendon out of the wound to ensure free excursion and then irrigated and closed with 2, 3-0 nylon sutures with mattress configuration.  I then injected 5 cc of Marcaine  into each wound.  We then let the tourniquet down and applied the dressing and the patient was extubated taken to recovery room in stable condition   Postoperative plan Elevate the hand for 72 hours.   Apply ice packs as needed to control swelling.   Move your fingers every hour opening and closing the hand to make a closed fist; 10 times per hour while awake  Remove the dressing in 2 days, 2 Band-Aids  Wear a glove over the wound and you can take a shower in 3 days   Take the pain medication as needed for 5 days then take tylenol  or advil and use ice and elevation to control swelling   Follow-up in 15 days for suture removal

## 2024-11-30 NOTE — Interval H&P Note (Signed)
 History and Physical Interval Note:  11/30/2024 8:57 AM  Sharon Pacheco  has presented today for surgery, with the diagnosis of trigger finger left middle and left thumb.  The various methods of treatment have been discussed with the patient and family. After consideration of risks, benefits and other options for treatment, the patient has consented to  Procedures: RELEASE, A1 PULLEY, FOR TRIGGER FINGERS (Left) hand: Left thumb   Left long/middle finger   as a surgical intervention.  The patient's history has been reviewed, patient examined, no change in status, stable for surgery.  I have reviewed the patient's chart and labs.  Questions were answered to the patient's satisfaction.     Taft Minerva

## 2024-11-30 NOTE — Anesthesia Preprocedure Evaluation (Signed)
"                                    Anesthesia Evaluation  Patient identified by MRN, date of birth, ID band Patient awake    Reviewed: Allergy & Precautions, H&P , NPO status , Patient's Chart, lab work & pertinent test results, reviewed documented beta blocker date and time   Airway Mallampati: II  TM Distance: >3 FB Neck ROM: full    Dental no notable dental hx.    Pulmonary COPD   Pulmonary exam normal breath sounds clear to auscultation       Cardiovascular Exercise Tolerance: Good hypertension, + CAD   Rhythm:regular Rate:Normal     Neuro/Psych  PSYCHIATRIC DISORDERS  Depression     Neuromuscular disease    GI/Hepatic negative GI ROS, Neg liver ROS,,,  Endo/Other  Hypothyroidism    Renal/GU negative Renal ROS  negative genitourinary   Musculoskeletal   Abdominal   Peds  Hematology negative hematology ROS (+)   Anesthesia Other Findings   Reproductive/Obstetrics negative OB ROS                              Anesthesia Physical Anesthesia Plan  ASA: 3  Anesthesia Plan: MAC   Post-op Pain Management:    Induction:   PONV Risk Score and Plan: Propofol  infusion  Airway Management Planned:   Additional Equipment:   Intra-op Plan:   Post-operative Plan:   Informed Consent: I have reviewed the patients History and Physical, chart, labs and discussed the procedure including the risks, benefits and alternatives for the proposed anesthesia with the patient or authorized representative who has indicated his/her understanding and acceptance.     Dental Advisory Given  Plan Discussed with: CRNA  Anesthesia Plan Comments:         Anesthesia Quick Evaluation  "

## 2024-11-30 NOTE — Transfer of Care (Addendum)
 Immediate Anesthesia Transfer of Care Note  Patient: Sharon Pacheco  Procedure(s) Performed: RELEASE, A1 PULLEY, FOR TRIGGER FINGER; LEFT MIDDLE FINGER AND LEFT THUMB (Left: Finger)  Patient Location: PACU  Anesthesia Type:General  Level of Consciousness: drowsy and patient cooperative  Airway & Oxygen Therapy: Patient Spontanous Breathing and Patient connected to face mask oxygen  Post-op Assessment: Report given to RN and Post -op Vital signs reviewed and stable  Post vital signs: Reviewed and stable  Last Vitals:  Vitals Value Taken Time  BP 120/85 11/30/24 10:09  Temp 36.4 11/30/24   10:09  Pulse 70 11/30/24 10:11  Resp 10 11/30/24 10:11  SpO2 100 % 11/30/24 10:11  Vitals shown include unfiled device data.  Last Pain:  Vitals:   11/30/24 0820  PainSc: 1       Patients Stated Pain Goal: 1 (11/30/24 0820)  Complications: No notable events documented.

## 2024-11-30 NOTE — Anesthesia Procedure Notes (Signed)
 Procedure Name: LMA Insertion Date/Time: 11/30/2024 9:23 AM  Performed by: Para Jerelene CROME, CRNAPre-anesthesia Checklist: Patient identified, Emergency Drugs available, Suction available and Patient being monitored Patient Re-evaluated:Patient Re-evaluated prior to induction Oxygen Delivery Method: Circle system utilized Preoxygenation: Pre-oxygenation with 100% oxygen Induction Type: IV induction Ventilation: Mask ventilation without difficulty LMA: LMA inserted LMA Size: 4.0 Tube type: Oral Number of attempts: 1 Placement Confirmation: positive ETCO2, CO2 detector and breath sounds checked- equal and bilateral ETT to lip (cm): No markings on Ambu Aurastraight LMA size 4. LMA secured once saeted propoerly. Tube secured with: Tape Dental Injury: Teeth and Oropharynx as per pre-operative assessment  Comments: Atraumatic insertion  of LMA x 1. Patient edentulous. Lips remain in preoperative condition.

## 2024-11-30 NOTE — Discharge Instructions (Signed)
 SABRA

## 2024-12-01 ENCOUNTER — Encounter (HOSPITAL_COMMUNITY): Payer: Self-pay | Admitting: Orthopedic Surgery

## 2024-12-06 NOTE — Anesthesia Postprocedure Evaluation (Signed)
"   Anesthesia Post Note  Patient: Sharon Pacheco  Procedure(s) Performed: RELEASE, A1 PULLEY, FOR TRIGGER FINGER; LEFT MIDDLE FINGER AND LEFT THUMB (Left: Finger)  Patient location during evaluation: Phase II Anesthesia Type: MAC Level of consciousness: awake Pain management: pain level controlled Vital Signs Assessment: post-procedure vital signs reviewed and stable Respiratory status: spontaneous breathing and respiratory function stable Cardiovascular status: blood pressure returned to baseline and stable Postop Assessment: no headache and no apparent nausea or vomiting Anesthetic complications: no Comments: Late entry   No notable events documented.   Last Vitals:  Vitals:   11/30/24 1030 11/30/24 1044  BP: (!) 123/58 (!) 131/47  Pulse: 67 63  Resp: 13 12  Temp:  36.4 C  SpO2: 96% 97%    Last Pain:  Vitals:   12/01/24 1514  TempSrc:   PainSc: 3                  [name]      "

## 2024-12-14 DIAGNOSIS — M65312 Trigger thumb, left thumb: Secondary | ICD-10-CM | POA: Insufficient documentation

## 2024-12-15 ENCOUNTER — Ambulatory Visit: Admitting: Orthopedic Surgery

## 2024-12-15 ENCOUNTER — Encounter: Admitting: Orthopedic Surgery

## 2024-12-15 ENCOUNTER — Encounter: Payer: Self-pay | Admitting: Orthopedic Surgery

## 2024-12-15 DIAGNOSIS — M65312 Trigger thumb, left thumb: Secondary | ICD-10-CM

## 2024-12-15 DIAGNOSIS — M65332 Trigger finger, left middle finger: Secondary | ICD-10-CM

## 2024-12-15 NOTE — Progress Notes (Signed)
"  ° °  Patient ID: Sharon Pacheco, female   DOB: 1956-06-11, 69 y.o.   MRN: [id number]    POST OP VISIT  POD # 1   Patient: Sharon Pacheco           Date of Birth: 06/01/1956           MRN: [id number] Visit Date: 12/15/2024 Requested by: Dow Longs, PA-C [address] [place],  [place] [address] PCP: Dow Longs, PA-C  Chief Complaint  Patient presents with   Post-op Follow-up    Trigger finger release left x2 middle andthumb     Encounter Diagnoses  Name Primary?   Trigger finger, left middle finger S/P release on 11/30/24 Yes   Trigger thumb, left thumb S/P release on 11/30/24     PROCEDURE: As stated  SUBJECTIVE:   No complaints  EXAM FINDINGS:   INCISION: no sign of infection, in either wound sutures removed successfully  No triggering   Assessment and plan:  Doing well postop visit #1 after release of the left middle or long finger and left thumb  prn   "

## 2024-12-15 NOTE — Progress Notes (Signed)
" ° ° °  12/15/2024   Chief Complaint  Patient presents with   Post-op Follow-up    Trigger finger release left x2 middle andthumb     Encounter Diagnoses  Name Primary?   Trigger finger, left middle finger S/P release on 11/30/24 Yes   Trigger thumb, left thumb S/P release on 11/30/24     What pharmacy do you use ? ____[facility]_______  DOI/DOS/ Date: 11/30/24  Did you get better, worse or no change (Answer below)   Improved sutures removed       "
